# Patient Record
Sex: Male | Born: 1975 | Race: White | Hispanic: No | Marital: Married | Smoking: Never smoker
Health system: Southern US, Community
[De-identification: ages and names within clinical notes are randomized; demographics above are authoritative.]

## PROBLEM LIST (undated history)

## (undated) DIAGNOSIS — G4733 Obstructive sleep apnea (adult) (pediatric): Secondary | ICD-10-CM

## (undated) DIAGNOSIS — G4730 Sleep apnea, unspecified: Secondary | ICD-10-CM

## (undated) DIAGNOSIS — K649 Unspecified hemorrhoids: Secondary | ICD-10-CM

## (undated) DIAGNOSIS — D696 Thrombocytopenia, unspecified: Secondary | ICD-10-CM

## (undated) DIAGNOSIS — E039 Hypothyroidism, unspecified: Secondary | ICD-10-CM

## (undated) DIAGNOSIS — Z9989 Dependence on other enabling machines and devices: Secondary | ICD-10-CM

## (undated) HISTORY — DX: Unspecified hemorrhoids: K64.9

## (undated) HISTORY — PX: OTHER SURGICAL HISTORY: SHX169

## (undated) HISTORY — DX: Hypothyroidism, unspecified: E03.9

## (undated) HISTORY — DX: Thrombocytopenia, unspecified: D69.6

## (undated) HISTORY — DX: Dependence on other enabling machines and devices: Z99.89

## (undated) HISTORY — DX: Obstructive sleep apnea (adult) (pediatric): G47.33

## (undated) HISTORY — DX: Sleep apnea, unspecified: G47.30

---

## 2007-01-09 ENCOUNTER — Ambulatory Visit: Payer: Self-pay | Admitting: Family Medicine

## 2008-01-02 ENCOUNTER — Observation Stay: Payer: Self-pay | Admitting: Internal Medicine

## 2008-01-02 ENCOUNTER — Other Ambulatory Visit: Payer: Self-pay

## 2010-08-08 HISTORY — PX: OTHER SURGICAL HISTORY: SHX169

## 2010-08-30 ENCOUNTER — Ambulatory Visit: Payer: Self-pay | Admitting: Family Medicine

## 2013-09-13 ENCOUNTER — Ambulatory Visit: Payer: Self-pay | Admitting: Unknown Physician Specialty

## 2016-03-24 DIAGNOSIS — E038 Other specified hypothyroidism: Secondary | ICD-10-CM | POA: Insufficient documentation

## 2016-04-22 ENCOUNTER — Inpatient Hospital Stay: Payer: BC Managed Care – PPO | Attending: Internal Medicine | Admitting: Internal Medicine

## 2016-04-22 ENCOUNTER — Encounter (INDEPENDENT_AMBULATORY_CARE_PROVIDER_SITE_OTHER): Payer: Self-pay

## 2016-04-22 ENCOUNTER — Encounter: Payer: Self-pay | Admitting: Internal Medicine

## 2016-04-22 DIAGNOSIS — R161 Splenomegaly, not elsewhere classified: Secondary | ICD-10-CM | POA: Diagnosis not present

## 2016-04-22 DIAGNOSIS — Z9989 Dependence on other enabling machines and devices: Secondary | ICD-10-CM

## 2016-04-22 DIAGNOSIS — G4733 Obstructive sleep apnea (adult) (pediatric): Secondary | ICD-10-CM | POA: Diagnosis not present

## 2016-04-22 DIAGNOSIS — D696 Thrombocytopenia, unspecified: Secondary | ICD-10-CM | POA: Diagnosis not present

## 2016-04-22 DIAGNOSIS — E039 Hypothyroidism, unspecified: Secondary | ICD-10-CM | POA: Insufficient documentation

## 2016-04-22 DIAGNOSIS — Q7143 Longitudinal reduction defect of radius, bilateral: Secondary | ICD-10-CM | POA: Insufficient documentation

## 2016-04-22 DIAGNOSIS — Z79899 Other long term (current) drug therapy: Secondary | ICD-10-CM | POA: Diagnosis not present

## 2016-04-22 NOTE — Progress Notes (Signed)
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     HISTORY OF PRESENTING ILLNESS:  Kent Gordon 40 y.o.  male very pleasant, with a history of congenital bilateral absent radius and long-standing history of thrombocytopenia- has been referred to [place] for further evaluation for his mildly worsening thrombocytopenia.  Patient denies any easy bruising or bleeding. Denies any gum bleeding or nose bleeds. No blood in stools black red stools. His appetite in general is good. Denies any nausea vomiting.  Patient states that his platelets have been > 100 for many years- but more recently started trending down to 90s to 80s.    ROS: A complete 10 point review of system is done which is negative except mentioned above in history of present illness  MEDICAL HISTORY:  Past Medical History:  Diagnosis Date  . Hemorrhoids   . Hypothyroidism   . Obstructive sleep apnea on CPAP   . Sleep apnea   . Thrombocytopenia ([place])     SURGICAL HISTORY: Past Surgical History:  Procedure Laterality Date  . Radius surgeries x 6    . Right ACL surgery  2012    SOCIAL HISTORY: ocassional alochol; no smoking; assistant principal; [name], [employer] Social History   Social History  . Marital status: Married    Spouse name: N/A  . Number of children: N/A  . Years of education: N/A   Occupational History  . Not on file.   Social History Main Topics  . Smoking status: Never Smoker  . Smokeless tobacco: Never Used  . Alcohol use No  . Drug use: Unknown  . Sexual activity: Not on file   Other Topics Concern  . Not on file   Social History Narrative  . No narrative on file    FAMILY HISTORY: grandma/mat- low platelets.  History reviewed. No pertinent family history.  ALLERGIES:  has No Known  Allergies.  MEDICATIONS:  Current Outpatient Prescriptions  Medication Sig Dispense Refill  . levothyroxine (SYNTHROID, LEVOTHROID) 50 MCG tablet Take 1 tablet by mouth every morning.     No current facility-administered medications for this visit.       Marland Kitchen  PHYSICAL EXAMINATION: ECOG PERFORMANCE STATUS: 0 - Asymptomatic  Vitals:   04/22/16 1337  BP: 139/80  Pulse: (!) 110  Resp: 20  Temp: 98.1 F (36.7 C)   Filed Weights   04/22/16 1337  Weight: 210 lb (95.3 kg)    GENERAL: Well-nourished well-developed; Alert, no distress and comfortable.   Alone.  EYES: no pallor or icterus OROPHARYNX: no thrush or ulceration; good dentition  NECK: supple, no masses felt LYMPH:  no palpable lymphadenopathy in the cervical, axillary or inguinal regions LUNGS: clear to auscultation and  No wheeze or crackles HEART/CVS: regular rate & rhythm and no murmurs; No lower extremity edema ABDOMEN: abdomen soft, non-tender and normal bowel sounds Musculoskeletal:no cyanosis of digits and no clubbing; has bilateral upper extremity deformities PSYCH: alert & oriented x 3 with fluent speech NEURO: no focal motor/sensory deficits SKIN:  no rashes or significant lesions  LABORATORY DATA:  I have reviewed the data as listed No results found for: WBC, HGB, HCT, MCV, PLT No results for input(s): NA, K, CL, CO2, GLUCOSE, BUN, CREATININE, CALCIUM, GFRNONAA, GFRAA, PROT, ALBUMIN, AST, ALT, ALKPHOS, BILITOT, BILIDIR, IBILI in the last 8760 hours.  RADIOGRAPHIC STUDIES: I have personally reviewed the radiological images as listed and agreed with the findings in the report. No results found.  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 However more recently platelets have been running low 80s to 90s the etiology of the trend in platelets is unclear.  # Check CBC CMP; B12  flow cytometry hepatitis panel also check CT  of the abdomen and pelvis to rule out splenomegaly/liver disease. Discussed with the patient that if his platelets are significantly lower from his baseline that I would recommend a bone marrow biopsy for further evaluation.   # The above plan of care was discussed with the patient in detail. Patient will otherwise follow-up with me in approximately 3 weeks or so to review the results.  Thank you Dr/Linthavong for allowing me to participate in the care of your pleasant patient. Please do not hesitate to contact me with questions or concerns in the interim.  # 45 minutes face-to-face with the patient discussing the above plan of care; more than 50% of time spent on prognosis/ natural history; counseling and coordination.  All questions were answered. The patient knows to call the clinic with any problems, questions or concerns.     Cammie Sickle, MD 04/24/2016 11:01 AM

## 2016-04-22 NOTE — Progress Notes (Signed)
Patient here for hematology consult with Dr. Donneta RombergBrahmanday. He reports Chronic h/o thromobocytopenia since childhood.   Recently dx with hypothyroidism. Fatigue-(grade 1) has improved since starting synthroid 50 mcg.

## 2016-04-24 NOTE — Assessment & Plan Note (Signed)
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 However more recently platelets have been running low 80s to 90s the etiology of the trend in platelets is unclear.  # Check CBC CMP; B12  flow cytometry hepatitis panel also check CT of the abdomen and pelvis to rule out splenomegaly/liver disease. Discussed with the patient that if his platelets are significantly lower from his baseline that I would recommend a bone marrow biopsy for further evaluation.   # The above plan of care was discussed with the patient in detail. Patient will otherwise follow-up with me in approximately 3 weeks or so to review the results.  Thank you Dr/Linthavong for allowing me to participate in the care of your pleasant patient. Please do not hesitate to contact me with questions or concerns in the interim.  # 45 minutes face-to-face with the patient discussing the above plan of care; more than 50% of time spent on prognosis/ natural history; counseling and coordination.

## 2016-04-25 ENCOUNTER — Encounter (INDEPENDENT_AMBULATORY_CARE_PROVIDER_SITE_OTHER): Payer: Self-pay

## 2016-04-25 ENCOUNTER — Inpatient Hospital Stay: Payer: BC Managed Care – PPO

## 2016-04-25 DIAGNOSIS — D696 Thrombocytopenia, unspecified: Secondary | ICD-10-CM

## 2016-04-25 LAB — CBC WITH DIFFERENTIAL/PLATELET
BASOS ABS: 0 10*3/uL (ref 0–0.1)
BASOS PCT: 0 %
EOS ABS: 0.3 10*3/uL (ref 0–0.7)
Eosinophils Relative: 2 %
HEMATOCRIT: 42 % (ref 40.0–52.0)
HEMOGLOBIN: 14.8 g/dL (ref 13.0–18.0)
Lymphocytes Relative: 20 %
Lymphs Abs: 2.8 10*3/uL (ref 1.0–3.6)
MCH: 30 pg (ref 26.0–34.0)
MCHC: 35.2 g/dL (ref 32.0–36.0)
MCV: 85.2 fL (ref 80.0–100.0)
MONOS PCT: 4 %
Monocytes Absolute: 0.5 10*3/uL (ref 0.2–1.0)
NEUTROS ABS: 10.2 10*3/uL — AB (ref 1.4–6.5)
NEUTROS PCT: 74 %
Platelets: 107 10*3/uL — ABNORMAL LOW (ref 150–440)
RBC: 4.93 MIL/uL (ref 4.40–5.90)
RDW: 15.6 % — ABNORMAL HIGH (ref 11.5–14.5)
WBC: 13.8 10*3/uL — AB (ref 3.8–10.6)

## 2016-04-25 LAB — COMPREHENSIVE METABOLIC PANEL
ALBUMIN: 4.6 g/dL (ref 3.5–5.0)
ALK PHOS: 51 U/L (ref 38–126)
ALT: 19 U/L (ref 17–63)
ANION GAP: 8 (ref 5–15)
AST: 17 U/L (ref 15–41)
BILIRUBIN TOTAL: 1.6 mg/dL — AB (ref 0.3–1.2)
BUN: 16 mg/dL (ref 6–20)
CALCIUM: 8.8 mg/dL — AB (ref 8.9–10.3)
CO2: 23 mmol/L (ref 22–32)
Chloride: 105 mmol/L (ref 101–111)
Creatinine, Ser: 0.93 mg/dL (ref 0.61–1.24)
Glucose, Bld: 109 mg/dL — ABNORMAL HIGH (ref 65–99)
POTASSIUM: 3.7 mmol/L (ref 3.5–5.1)
Sodium: 136 mmol/L (ref 135–145)
TOTAL PROTEIN: 7.2 g/dL (ref 6.5–8.1)

## 2016-04-25 LAB — LACTATE DEHYDROGENASE: LDH: 104 U/L (ref 98–192)

## 2016-04-25 LAB — VITAMIN B12: VITAMIN B 12: 433 pg/mL (ref 180–914)

## 2016-04-26 LAB — HEPATITIS B SURFACE ANTIGEN: Hepatitis B Surface Ag: NEGATIVE

## 2016-04-26 LAB — HEPATITIS B CORE ANTIBODY, IGM: Hep B C IgM: NEGATIVE

## 2016-04-26 LAB — HEPATITIS C ANTIBODY: HCV Ab: <0.1 s/co ratio (ref 0.0–0.9)

## 2016-04-28 ENCOUNTER — Ambulatory Visit: Payer: BC Managed Care – PPO

## 2016-04-28 LAB — COMP PANEL: LEUKEMIA/LYMPHOMA

## 2016-04-29 ENCOUNTER — Ambulatory Visit: Admission: RE | Admit: 2016-04-29 | Payer: BC Managed Care – PPO | Source: Ambulatory Visit

## 2016-05-02 ENCOUNTER — Other Ambulatory Visit: Payer: Self-pay | Admitting: Internal Medicine

## 2016-05-02 DIAGNOSIS — R161 Splenomegaly, not elsewhere classified: Secondary | ICD-10-CM

## 2016-05-02 NOTE — Progress Notes (Signed)
Cancel the CT A/P- I ordered CT Abdomen- with constrast.

## 2016-05-04 ENCOUNTER — Ambulatory Visit: Payer: BC Managed Care – PPO

## 2016-05-04 ENCOUNTER — Ambulatory Visit
Admission: RE | Admit: 2016-05-04 | Discharge: 2016-05-04 | Disposition: A | Discharge status: Home / Self Care | Payer: BC Managed Care – PPO | Source: Ambulatory Visit | Attending: Internal Medicine | Admitting: Internal Medicine

## 2016-05-04 DIAGNOSIS — R161 Splenomegaly, not elsewhere classified: Secondary | ICD-10-CM | POA: Insufficient documentation

## 2016-05-04 MED ORDER — IOPAMIDOL (ISOVUE-300) INJECTION 61%
100.0000 mL | Freq: Once | INTRAVENOUS | Status: AC | PRN
  Administered 2016-05-04: 100 mL via INTRAVENOUS

## 2016-05-09 ENCOUNTER — Telehealth: Payer: Self-pay | Admitting: *Deleted

## 2016-05-09 NOTE — Telephone Encounter (Signed)
-----  Message from Cammie Sickle, MD sent at 05/09/2016  1:31 PM EDT ----- Please inform patient that scan showed no splenomegaly;  we will plan to hold off bone marrow biopsy at this time; plan follow-up as scheduled.

## 2016-05-09 NOTE — Telephone Encounter (Signed)
Per patient's request -   Contacted patient via work number. Left pt a voice mail on work phone to contact cancer ctr.

## 2016-05-09 NOTE — Telephone Encounter (Signed)
Left vm asking patient to return my phone call to discuss test results.

## 2016-05-09 NOTE — Telephone Encounter (Signed)
Returning [place] call asked to call back on cell or at work [phone] and tell them it is MD office calling.

## 2016-05-13 ENCOUNTER — Ambulatory Visit: Payer: Self-pay | Admitting: Internal Medicine

## 2016-05-18 ENCOUNTER — Encounter: Payer: Self-pay | Admitting: Internal Medicine

## 2016-05-18 ENCOUNTER — Inpatient Hospital Stay: Payer: BC Managed Care – PPO | Attending: Internal Medicine | Admitting: Internal Medicine

## 2016-05-18 VITALS — BP 121/81 | HR 94 | Temp 96.7°F | Resp 17 | Ht 68.5 in | Wt 212.8 lb

## 2016-05-18 DIAGNOSIS — G4733 Obstructive sleep apnea (adult) (pediatric): Secondary | ICD-10-CM | POA: Diagnosis not present

## 2016-05-18 DIAGNOSIS — E039 Hypothyroidism, unspecified: Secondary | ICD-10-CM | POA: Insufficient documentation

## 2016-05-18 DIAGNOSIS — Z9989 Dependence on other enabling machines and devices: Secondary | ICD-10-CM | POA: Insufficient documentation

## 2016-05-18 DIAGNOSIS — D696 Thrombocytopenia, unspecified: Secondary | ICD-10-CM | POA: Insufficient documentation

## 2016-05-18 DIAGNOSIS — Z79899 Other long term (current) drug therapy: Secondary | ICD-10-CM

## 2016-05-18 NOTE — Progress Notes (Signed)
CT was 9/26

## 2016-05-18 NOTE — Assessment & Plan Note (Addendum)
#  Thrombocytopenia- unclear etiology; possible ITP. Most recent platelets 107/significant decline. Chronic. CT scan negative for cirrhosis or splenomegaly.  # Discussed regarding bone marrow biopsy; however warranted as patient is currently asymptomatic/and platelets are stable.  # Follow-up CBC in 3 months follow-up with me in 6 months with labs. He will call us sooner if the platelets drop; already gets symptomatic.

## 2016-05-18 NOTE — Progress Notes (Signed)
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   # Congenital Bilateral Absent Radii   No history exists.     HISTORY OF PRESENTING ILLNESS:  Kent Gordon 40 y.o.  male very pleasant, with a history of congenital bilateral absent radius and long-standing history of thrombocytopenia- Is here to review the results of his CAT scan.  Patient continues to be asymptomatic. Denies any bleeding.  Patient states that his platelets have been > 100 for many years- but more recently started trending down to 90s to 80s; however repeat blood count with [place] 3 weeks ago was 107  ROS: A complete 10 point review of system is done which is negative except mentioned above in history of present illness  MEDICAL HISTORY:  Past Medical History:  Diagnosis Date  . Hemorrhoids   . Hypothyroidism   . Obstructive sleep apnea on CPAP   . Sleep apnea   . Thrombocytopenia ([place])     SURGICAL HISTORY: Past Surgical History:  Procedure Laterality Date  . Radius surgeries x 6    . Right ACL surgery  2012    SOCIAL HISTORY: ocassional alochol; no smoking; assistant principal; [name], [employer] Social History   Social History  . Marital status: Married    Spouse name: N/A  . Number of children: N/A  . Years of education: N/A   Occupational History  . Not on file.   Social History Main Topics  . Smoking status: Never Smoker  . Smokeless tobacco: Never Used  . Alcohol use No  . Drug use: No  . Sexual activity: Not on file   Other Topics Concern  . Not on file   Social History Narrative  . No narrative on file    FAMILY HISTORY: grandma/mat- low platelets.  History reviewed. No pertinent family history.  ALLERGIES:  has No Known  Allergies.  MEDICATIONS:  Current Outpatient Prescriptions  Medication Sig Dispense Refill  . levothyroxine (SYNTHROID, LEVOTHROID) 50 MCG tablet Take 1 tablet by mouth every morning.     No current facility-administered medications for this visit.       Marland Kitchen  PHYSICAL EXAMINATION: ECOG PERFORMANCE STATUS: 0 - Asymptomatic  Vitals:   05/18/16 1549  BP: 121/81  Pulse: 94  Resp: 17  Temp: (!) 96.7 F (35.9 C)   Filed Weights   05/18/16 1549  Weight: 212 lb 12.8 oz (96.5 kg)    GENERAL: Well-nourished well-developed; Alert, no distress and comfortable.   Alone.  EYES: no pallor or icterus OROPHARYNX: no thrush or ulceration; good dentition  NECK: supple, no masses felt LYMPH:  no palpable lymphadenopathy in the cervical, axillary or inguinal regions LUNGS: clear to auscultation and  No wheeze or crackles HEART/CVS: regular rate & rhythm and no murmurs; No lower extremity edema ABDOMEN: abdomen soft, non-tender and normal bowel sounds Musculoskeletal:no cyanosis of digits and no clubbing; has bilateral upper extremity deformities PSYCH: alert & oriented x 3 with fluent speech NEURO: no focal motor/sensory deficits SKIN:  no rashes or significant lesions  LABORATORY DATA:  I have reviewed the data as listed Lab Results  Component Value Date   WBC 13.8 (H) 04/25/2016   HGB 14.8 04/25/2016   HCT 42.0 04/25/2016   MCV 85.2 04/25/2016   PLT 107 (L) 04/25/2016  Recent Labs  04/25/16 1431  NA 136  K 3.7  CL 105  CO2 23  GLUCOSE 109*  BUN 16  CREATININE 0.93  CALCIUM 8.8*  GFRNONAA >60  GFRAA >60  PROT 7.2  ALBUMIN 4.6  AST 17  ALT 19  ALKPHOS 51  BILITOT 1.6*    RADIOGRAPHIC STUDIES: I have personally reviewed the radiological images as listed and agreed with the findings in the report. Ct Abdomen W Contrast  Result Date: 05/04/2016 CLINICAL DATA:  Splenomegaly EXAM: CT ABDOMEN WITH CONTRAST TECHNIQUE: Multidetector CT imaging of the abdomen was  performed using the standard protocol following bolus administration of intravenous contrast. CONTRAST:  150m ISOVUE-300 IOPAMIDOL (ISOVUE-300) INJECTION 61% COMPARISON:  None. FINDINGS: Lower chest: Lung bases are essentially clear. Hepatobiliary: Liver is within normal limits. Gallbladder is unremarkable. No intrahepatic or extrahepatic ductal dilatation. Pancreas: Within normal limits. Spleen: Within normal limits for size. Maximal dimension is 12.6 cm in the oblique sagittal plane (series 6/ image 94). No discrete splenic mass is seen. Adrenals/Urinary Tract: Adrenal glands are within normal limits. Kidneys are within normal limits.  No hydronephrosis. Stomach/Bowel: Stomach is within normal limits. Visualized bowel is grossly unremarkable. Vascular/Lymphatic: No evidence of abdominal aortic aneurysm. No suspicious abdominal lymphadenopathy. Other: No abdominal ascites. Musculoskeletal: Mild degenerative changes of the lower lumbar spine. IMPRESSION: Spleen is within normal limits for size, measuring 12.6 cm in maximal dimension. No discrete splenic mass is seen. Otherwise unremarkable CT abdomen. Please note that the pelvis was not imaged. Electronically Signed   By: SJulian HyM.D.   On: 05/04/2016 17:30    ASSESSMENT & PLAN:   [place] ([place] # Thrombocytopenia- unclear etiology; possible ITP. Most recent platelets 107/significant decline. Chronic. CT scan negative for cirrhosis or splenomegaly.  # Discussed regarding bone marrow biopsy; however warranted as patient is currently asymptomatic/and platelets are stable.  # Follow-up CBC in 3 months follow-up with me in 6 months with labs. He will [place] if the platelets drop; already gets symptomatic.      GCammie Sickle MD 05/18/2016 4:19 PM

## 2016-08-18 ENCOUNTER — Inpatient Hospital Stay: Payer: BC Managed Care – PPO

## 2016-11-16 ENCOUNTER — Inpatient Hospital Stay: Payer: BC Managed Care – PPO

## 2016-11-16 ENCOUNTER — Inpatient Hospital Stay: Payer: BC Managed Care – PPO | Admitting: Internal Medicine

## 2016-12-05 ENCOUNTER — Encounter: Payer: Self-pay | Admitting: Internal Medicine

## 2016-12-07 ENCOUNTER — Inpatient Hospital Stay: Payer: BC Managed Care – PPO | Attending: Internal Medicine

## 2016-12-07 ENCOUNTER — Inpatient Hospital Stay (HOSPITAL_BASED_OUTPATIENT_CLINIC_OR_DEPARTMENT_OTHER): Payer: BC Managed Care – PPO | Admitting: Internal Medicine

## 2016-12-07 VITALS — BP 126/85 | HR 98 | Temp 98.8°F | Wt 209.1 lb

## 2016-12-07 DIAGNOSIS — D696 Thrombocytopenia, unspecified: Secondary | ICD-10-CM | POA: Insufficient documentation

## 2016-12-07 DIAGNOSIS — Z9989 Dependence on other enabling machines and devices: Secondary | ICD-10-CM | POA: Insufficient documentation

## 2016-12-07 DIAGNOSIS — G4733 Obstructive sleep apnea (adult) (pediatric): Secondary | ICD-10-CM | POA: Diagnosis not present

## 2016-12-07 DIAGNOSIS — E039 Hypothyroidism, unspecified: Secondary | ICD-10-CM

## 2016-12-07 DIAGNOSIS — Z79899 Other long term (current) drug therapy: Secondary | ICD-10-CM | POA: Diagnosis not present

## 2016-12-07 DIAGNOSIS — Q7143 Longitudinal reduction defect of radius, bilateral: Secondary | ICD-10-CM | POA: Diagnosis not present

## 2016-12-07 LAB — CBC WITH DIFFERENTIAL/PLATELET
BASOS ABS: 0.2 10*3/uL — AB (ref 0–0.1)
BASOS PCT: 1 %
EOS ABS: 0.2 10*3/uL (ref 0–0.7)
EOS PCT: 2 %
HCT: 43.3 % (ref 40.0–52.0)
Hemoglobin: 15.2 g/dL (ref 13.0–18.0)
Lymphocytes Relative: 20 %
Lymphs Abs: 2.7 10*3/uL (ref 1.0–3.6)
MCH: 29.7 pg (ref 26.0–34.0)
MCHC: 35 g/dL (ref 32.0–36.0)
MCV: 85 fL (ref 80.0–100.0)
MONO ABS: 0.5 10*3/uL (ref 0.2–1.0)
Monocytes Relative: 4 %
Neutro Abs: 9.9 10*3/uL — ABNORMAL HIGH (ref 1.4–6.5)
Neutrophils Relative %: 73 %
PLATELETS: 98 10*3/uL — AB (ref 150–440)
RBC: 5.1 MIL/uL (ref 4.40–5.90)
RDW: 15.4 % — AB (ref 11.5–14.5)
WBC: 13.5 10*3/uL — AB (ref 3.8–10.6)

## 2016-12-07 NOTE — Progress Notes (Signed)
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   # Congenital Bilateral Absent Radii   No history exists.     HISTORY OF PRESENTING ILLNESS:  Kent Gordon 41 y.o.  male very pleasant, with a history of congenital bilateral absent radius and long-standing history of thrombocytopenia- Is here for follow-up.  Patient continues to be asymptomatic. Denies any bleeding. His fairly active. No petechial rash. No weight loss or loss of appetite or night sweats.  ROS: A complete 10 point review of system is done which is negative except mentioned above in history of present illness  MEDICAL HISTORY:  Past Medical History:  Diagnosis Date  . Hemorrhoids   . Hypothyroidism   . Obstructive sleep apnea on CPAP   . Sleep apnea   . Thrombocytopenia ([place])     SURGICAL HISTORY: Past Surgical History:  Procedure Laterality Date  . Radius surgeries x 6    . Right ACL surgery  2012    SOCIAL HISTORY: ocassional alochol; no smoking; assistant principal; [name], [employer] Social History   Social History  . Marital status: Married    Spouse name: N/A  . Number of children: N/A  . Years of education: N/A   Occupational History  . Not on file.   Social History Main Topics  . Smoking status: Never Smoker  . Smokeless tobacco: Never Used  . Alcohol use No  . Drug use: No  . Sexual activity: Not on file   Other Topics Concern  . Not on file   Social History Narrative  . No narrative on file    FAMILY HISTORY: grandma/mat- low platelets.  No family history on file.  ALLERGIES:  has No Known Allergies.  MEDICATIONS:  Current Outpatient Prescriptions  Medication Sig Dispense Refill  . levothyroxine (SYNTHROID, LEVOTHROID) 50  MCG tablet Take 1 tablet by mouth every morning.     No current facility-administered medications for this visit.       Marland Kitchen  PHYSICAL EXAMINATION: ECOG PERFORMANCE STATUS: 0 - Asymptomatic  Vitals:   12/07/16 1520  BP: 126/85  Pulse: 98  Temp: 98.8 F (37.1 C)   Filed Weights   12/07/16 1520  Weight: 209 lb 2 oz (94.9 kg)    GENERAL: Well-nourished well-developed; Alert, no distress and comfortable.   Alone.  EYES: no pallor or icterus OROPHARYNX: no thrush or ulceration; good dentition  NECK: supple, no masses felt LYMPH:  no palpable lymphadenopathy in the cervical, axillary or inguinal regions LUNGS: clear to auscultation and  No wheeze or crackles HEART/CVS: regular rate & rhythm and no murmurs; No lower extremity edema ABDOMEN: abdomen soft, non-tender and normal bowel sounds Musculoskeletal:no cyanosis of digits and no clubbing; has bilateral upper extremity deformities PSYCH: alert & oriented x 3 with fluent speech NEURO: no focal motor/sensory deficits SKIN:  no rashes or significant lesions  LABORATORY DATA:  I have reviewed the data as listed Lab Results  Component Value Date   WBC 13.5 (H) 12/07/2016   HGB 15.2 12/07/2016   HCT 43.3 12/07/2016   MCV 85.0 12/07/2016   PLT 98 (L) 12/07/2016    Recent Labs  04/25/16 1431  NA 136  K 3.7  CL 105  CO2 23  GLUCOSE 109*  BUN 16  CREATININE 0.93  CALCIUM  8.8*  GFRNONAA >60  GFRAA >60  PROT 7.2  ALBUMIN 4.6  AST 17  ALT 19  ALKPHOS 51  BILITOT 1.6*    RADIOGRAPHIC STUDIES: I have personally reviewed the radiological images as listed and agreed with the findings in the report. No results found.  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 Platelets today- 98/ no significant decline. . CT scan negative for cirrhosis or splenomegaly.  # Discussed regarding bone marrow biopsy; however would not recommend as patient is currently  asymptomatic/and platelets are stable. A trial of steroids might also be reasonable prior to bone marrow biopsy.  # Follow-up CBC/MD in 6 m;  He will call us sooner if the platelets drop; already gets symptomatic.      Cammie Sickle, MD 12/07/2016 3:49 PM

## 2016-12-07 NOTE — Progress Notes (Signed)
Patient here today for follow up.  Patient states no new concerns today  

## 2016-12-07 NOTE — Assessment & Plan Note (Addendum)
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 Platelets today- 98/ no significant decline. . CT scan negative for cirrhosis or splenomegaly.  # Discussed regarding bone marrow biopsy; however would not recommend as patient is currently asymptomatic/and platelets are stable. A trial of steroids might also be reasonable prior to bone marrow biopsy.  # Follow-up CBC/MD in 6 m;  He will call us sooner if the platelets drop; already gets symptomatic.

## 2017-03-21 DIAGNOSIS — E669 Obesity, unspecified: Secondary | ICD-10-CM | POA: Insufficient documentation

## 2017-06-09 ENCOUNTER — Inpatient Hospital Stay: Payer: BC Managed Care – PPO

## 2017-06-09 ENCOUNTER — Inpatient Hospital Stay: Payer: BC Managed Care – PPO | Attending: Internal Medicine | Admitting: Internal Medicine

## 2017-06-09 VITALS — BP 115/86 | HR 88 | Temp 97.4°F | Resp 16 | Wt 211.8 lb

## 2017-06-09 DIAGNOSIS — Z9989 Dependence on other enabling machines and devices: Secondary | ICD-10-CM | POA: Diagnosis not present

## 2017-06-09 DIAGNOSIS — Q7143 Longitudinal reduction defect of radius, bilateral: Secondary | ICD-10-CM | POA: Insufficient documentation

## 2017-06-09 DIAGNOSIS — D696 Thrombocytopenia, unspecified: Secondary | ICD-10-CM | POA: Insufficient documentation

## 2017-06-09 DIAGNOSIS — E039 Hypothyroidism, unspecified: Secondary | ICD-10-CM | POA: Diagnosis not present

## 2017-06-09 DIAGNOSIS — G4733 Obstructive sleep apnea (adult) (pediatric): Secondary | ICD-10-CM | POA: Diagnosis not present

## 2017-06-09 DIAGNOSIS — Z79899 Other long term (current) drug therapy: Secondary | ICD-10-CM | POA: Insufficient documentation

## 2017-06-09 LAB — CBC WITH DIFFERENTIAL/PLATELET
Basophils Absolute: 0.1 10*3/uL (ref 0–0.1)
Basophils Relative: 1 %
EOS ABS: 0.3 10*3/uL (ref 0–0.7)
Eosinophils Relative: 2 %
HCT: 42.6 % (ref 40.0–52.0)
Hemoglobin: 14.7 g/dL (ref 13.0–18.0)
Lymphocytes Relative: 20 %
Lymphs Abs: 2.6 10*3/uL (ref 1.0–3.6)
MCH: 29.7 pg (ref 26.0–34.0)
MCHC: 34.6 g/dL (ref 32.0–36.0)
MCV: 85.8 fL (ref 80.0–100.0)
Monocytes Absolute: 0.6 10*3/uL (ref 0.2–1.0)
Monocytes Relative: 4 %
Neutro Abs: 9.5 10*3/uL — ABNORMAL HIGH (ref 1.4–6.5)
Neutrophils Relative %: 73 %
Platelets: 110 10*3/uL — ABNORMAL LOW (ref 150–440)
RBC: 4.97 MIL/uL (ref 4.40–5.90)
RDW: 15.2 % — AB (ref 11.5–14.5)
WBC: 13 10*3/uL — AB (ref 3.8–10.6)

## 2017-06-09 NOTE — Assessment & Plan Note (Addendum)
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 Platelets today- 98/ no significant decline.  CT scan negative for cirrhosis or splenomegaly.  # CHRONIC Mild Leucocytosis/neutrophilia- WBC 13- not increasing; monitor for now.   # Discussed regarding bone marrow biopsy; however would not recommend as patient is currently asymptomatic/and platelets are stable. A trial of steroids might also be reasonable prior to bone marrow biopsy.  # Follow-up CBC/MD in 12 m;  He will call us sooner if the platelets drop.  

## 2017-06-09 NOTE — Progress Notes (Signed)
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   # MILD CHRONIC NEUTROPHILIA- [?7078]  # Congenital Bilateral Absent Radii   No history exists.     HISTORY OF PRESENTING ILLNESS:  Kent Gordon 41 y.o.  male very pleasant, with a history of congenital bilateral absent radius and long-standing history of thrombocytopenia/mild neutrophilia- Is here for follow-up.  Patient denies any bleeding. Denies any petechial rash. No weight loss or loss of appetite or night sweats.  ROS: A complete 10 point review of system is done which is negative except mentioned above in history of present illness  MEDICAL HISTORY:  Past Medical History:  Diagnosis Date  . Hemorrhoids   . Hypothyroidism   . Obstructive sleep apnea on CPAP   . Sleep apnea   . Thrombocytopenia ([place])     SURGICAL HISTORY: Past Surgical History:  Procedure Laterality Date  . Radius surgeries x 6    . Right ACL surgery  2012    SOCIAL HISTORY: ocassional alochol; no smoking; assistant principal; Kent Gordon, [employer] Social History   Social History  . Marital status: Married    Spouse name: N/A  . Number of children: N/A  . Years of education: N/A   Occupational History  . Not on file.   Social History Main Topics  . Smoking status: Never Smoker  . Smokeless tobacco: Never Used  . Alcohol use No  . Drug use: No  . Sexual activity: Not on file   Other Topics Concern  . Not on file   Social History Narrative  . No narrative on file    FAMILY HISTORY: grandma/mat- low platelets.  No family history on file.  ALLERGIES:  has No Known Allergies.  MEDICATIONS:  Current Outpatient Prescriptions  Medication Sig Dispense Refill  . Levothyroxine Sodium 75  MCG CAPS Take 1 tablet by mouth every morning.     No current facility-administered medications for this visit.       Marland Kitchen  PHYSICAL EXAMINATION: ECOG PERFORMANCE STATUS: 0 - Asymptomatic  Vitals:   06/09/17 1122  BP: 115/86  Pulse: 88  Resp: 16  Temp: (!) 97.4 F (36.3 C)   Filed Weights   06/09/17 1122  Weight: 211 lb 12.8 oz (96.1 kg)    GENERAL: Well-nourished well-developed; Alert, no distress and comfortable.   Alone.  EYES: no pallor or icterus OROPHARYNX: no thrush or ulceration; good dentition  NECK: supple, no masses felt LYMPH:  no palpable lymphadenopathy in the cervical, axillary or inguinal regions LUNGS: clear to auscultation and  No wheeze or crackles HEART/CVS: regular rate & rhythm and no murmurs; No lower extremity edema ABDOMEN: abdomen soft, non-tender and normal bowel sounds Musculoskeletal:no cyanosis of digits and no clubbing; has bilateral upper extremity deformities PSYCH: alert & oriented x 3 with fluent speech NEURO: no focal motor/sensory deficits SKIN:  no rashes or significant lesions  LABORATORY DATA:  I have reviewed the data as listed Lab Results  Component Value Date   WBC 13.0 (H) 06/09/2017   HGB 14.7 06/09/2017   HCT 42.6 06/09/2017   MCV 85.8 06/09/2017   PLT 110 (L) 06/09/2017   No results for input(s): NA, K, CL, CO2, GLUCOSE, BUN, CREATININE, CALCIUM, GFRNONAA, GFRAA, PROT, ALBUMIN, AST, ALT, ALKPHOS, BILITOT, BILIDIR, IBILI in the last  8760 hours.  RADIOGRAPHIC STUDIES: I have personally reviewed the radiological images as listed and agreed with the findings in the report. No results found.  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 Platelets today- 98/ no significant decline.  CT scan negative for cirrhosis or splenomegaly.  # CHRONIC Mild Leucocytosis/neutrophilia- WBC 13- not increasing; monitor for now.   # Discussed regarding bone marrow biopsy;  however would not recommend as patient is currently asymptomatic/and platelets are stable. A trial of steroids might also be reasonable prior to bone marrow biopsy.  # Follow-up CBC/MD in 12 m;  He will call us sooner if the platelets drop.      Kent Sickle, MD 06/09/2017 1:17 PM

## 2017-08-07 IMAGING — CT CT ABDOMEN W/ CM
1 of 3 series · 13 of 32 positions shown, 19 images · IV contrast (APPLIED)
Comparison: None.

CLINICAL DATA: Splenomegaly

EXAM:
CT ABDOMEN WITH CONTRAST
TECHNIQUE: Multidetector CT imaging of the abdomen was performed using the
standard protocol following bolus administration of intravenous
contrast.
CONTRAST:  100mL [id number]-[id number] IOPAMIDOL (FH5B63-YQQ) INJECTION 61%

[Series 2: axial st · axial · 0.81mm/px · z∈[-916,-662]mm · 13 of 59 slices shown, 19 images]
[im 4/59  soft-tissue]
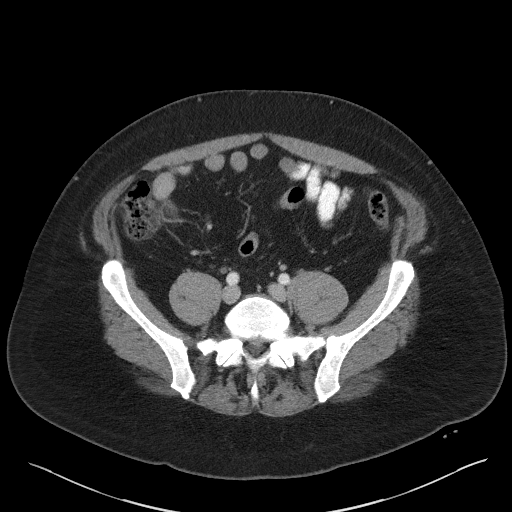
[im 4/59  bone]
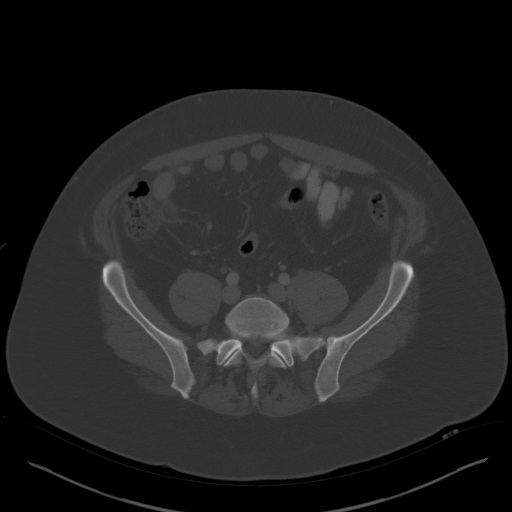
[im 8/59  soft-tissue]
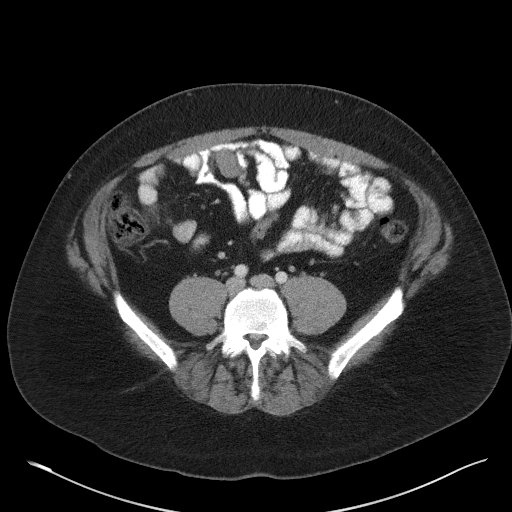
[im 12/59  soft-tissue]
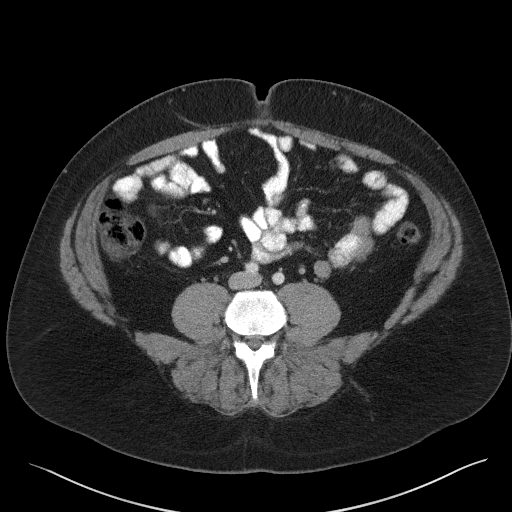
[im 16/59  soft-tissue]
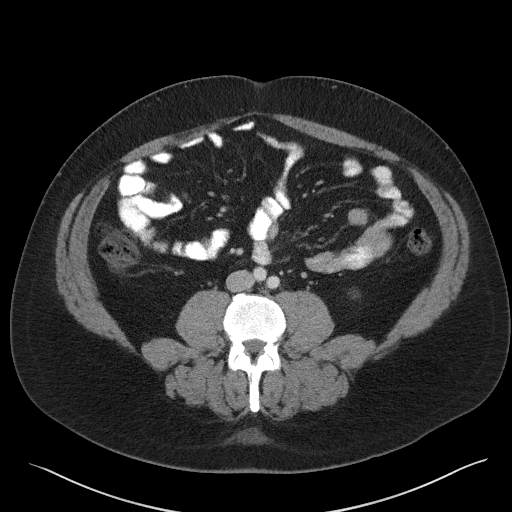
[im 20/59  soft-tissue]
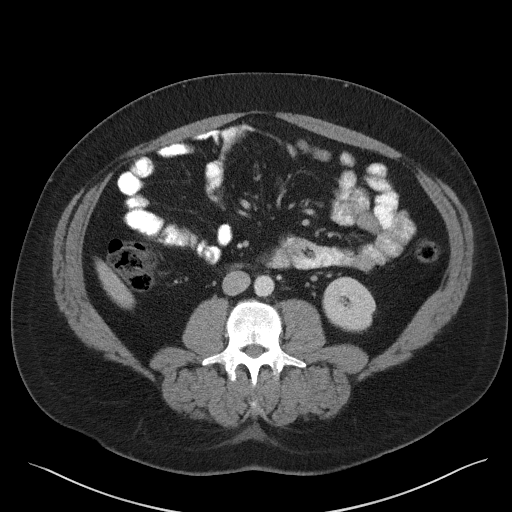
[im 24/59  soft-tissue]
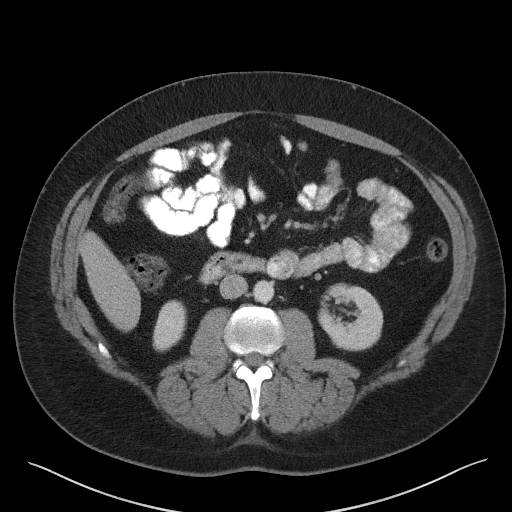
[im 31/59  soft-tissue]
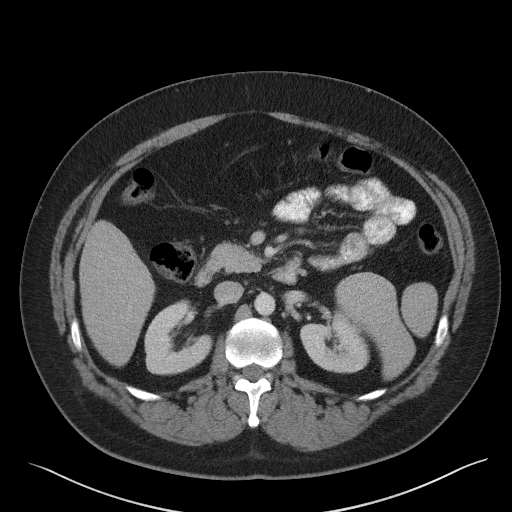
[im 35/59  soft-tissue]
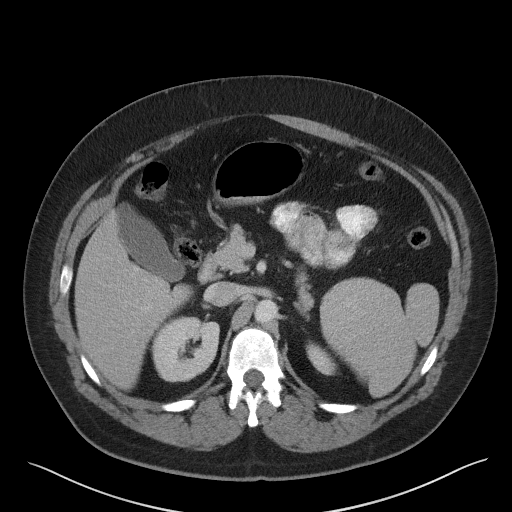
[im 39/59  soft-tissue]
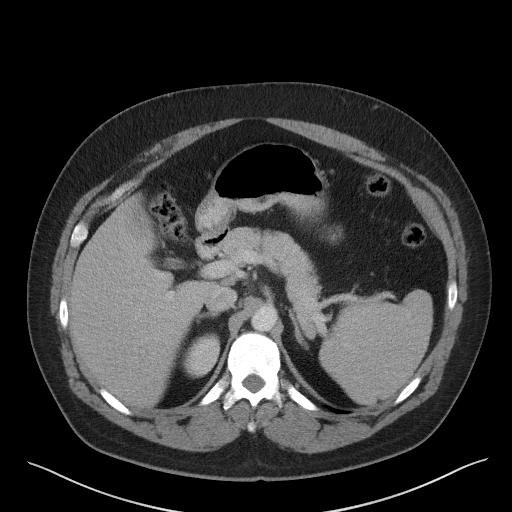
[im 39/59  bone]
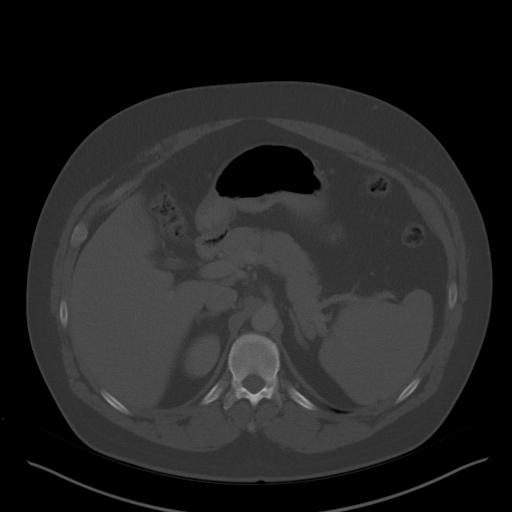
[im 43/59  soft-tissue]
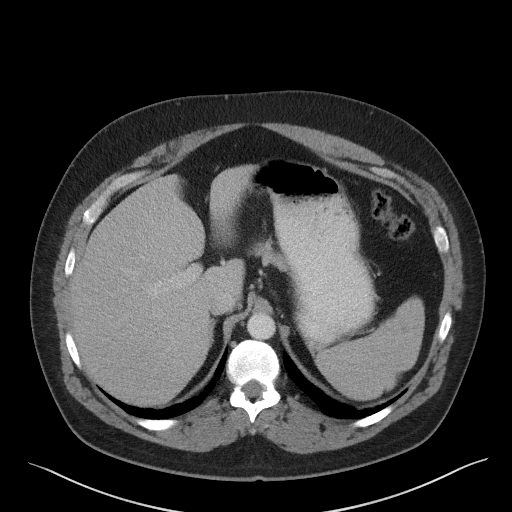
[im 43/59  lung]
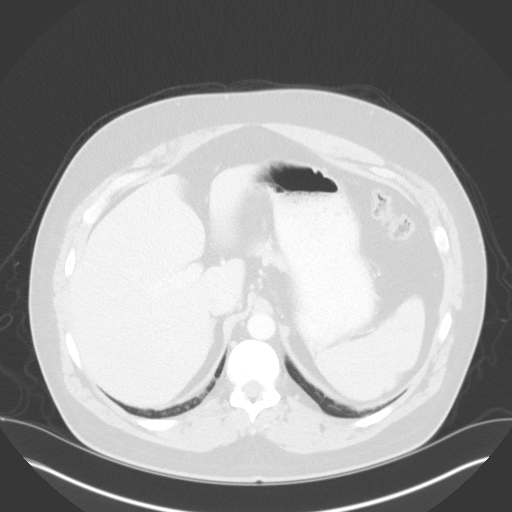
[im 47/59  soft-tissue]
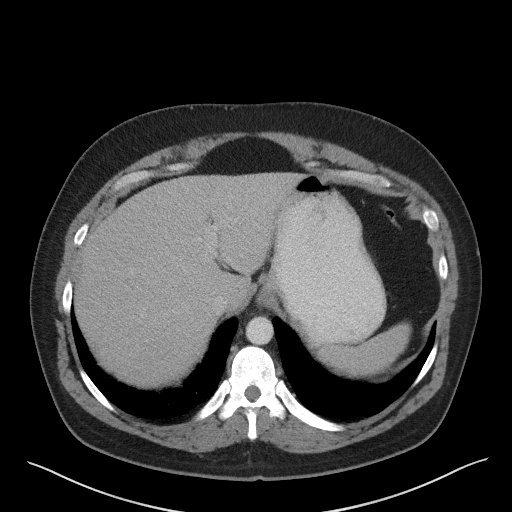
[im 47/59  lung]
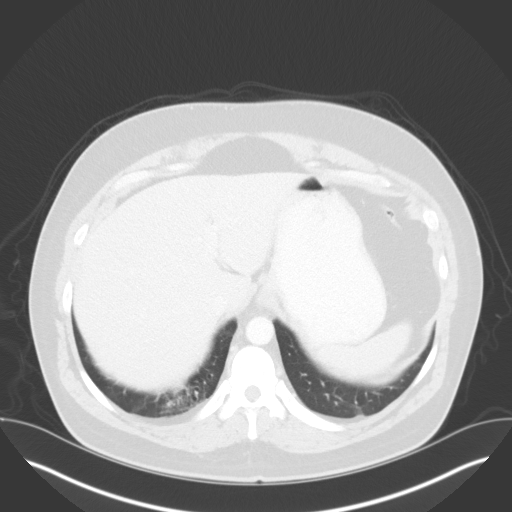
[im 51/59  soft-tissue]
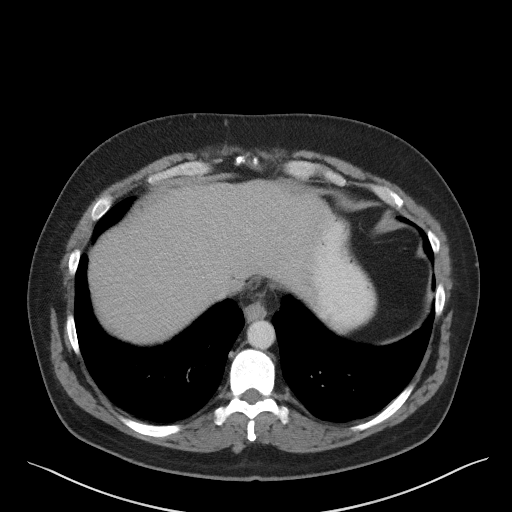
[im 51/59  lung]
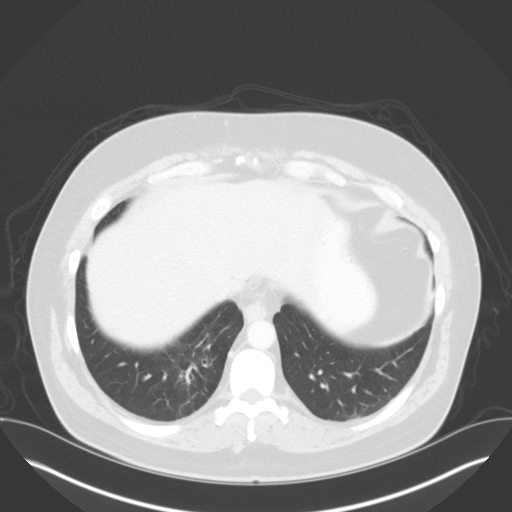
[im 55/59  soft-tissue]
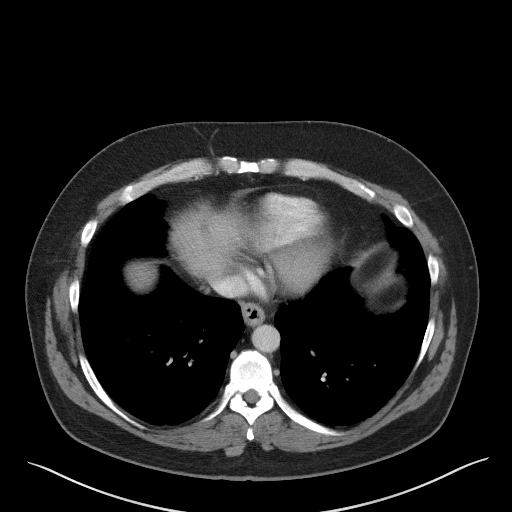
[im 55/59  lung]
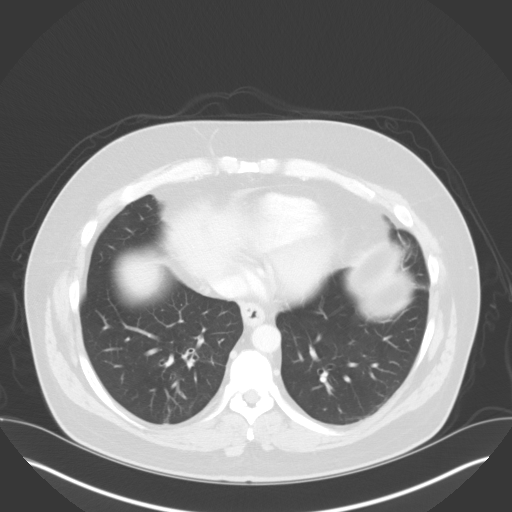

[13 of 32 positions shown; findings below may reference images not displayed]

FINDINGS: Lower chest: Lung bases are essentially clear.

Hepatobiliary: Liver is within normal limits.

Gallbladder is unremarkable. No intrahepatic or extrahepatic ductal
dilatation.

Pancreas: Within normal limits.

Spleen: Within normal limits for size. Maximal dimension is 12.6 cm
in the oblique sagittal plane (series 6/ image 94). No discrete
splenic mass is seen.

Adrenals/Urinary Tract: Adrenal glands are within normal limits.

Kidneys are within normal limits.  No hydronephrosis.

Stomach/Bowel: Stomach is within normal limits.

Visualized bowel is grossly unremarkable.

Vascular/Lymphatic: No evidence of abdominal aortic aneurysm.

No suspicious abdominal lymphadenopathy.

Other: No abdominal ascites.

Musculoskeletal: Mild degenerative changes of the lower lumbar
spine.
IMPRESSION: Spleen is within normal limits for size, measuring 12.6 cm in
maximal dimension. No discrete splenic mass is seen.

Otherwise unremarkable CT abdomen.

Please note that the pelvis was not imaged.

## 2018-06-11 ENCOUNTER — Inpatient Hospital Stay: Payer: BC Managed Care – PPO | Admitting: Internal Medicine

## 2018-06-11 ENCOUNTER — Inpatient Hospital Stay: Payer: BC Managed Care – PPO

## 2018-06-11 NOTE — Progress Notes (Deleted)
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   # MILD CHRONIC NEUTROPHILIA- [?2006]  # Congenital Bilateral Absent Radii   No history exists.     HISTORY OF PRESENTING ILLNESS:  Kent Gordon 42 y.o.  male very pleasant, with a history of congenital bilateral absent radius and long-standing history of thrombocytopenia/mild neutrophilia- Is here for follow-up.  Patient denies any bleeding. Denies any petechial rash. No weight loss or loss of appetite or night sweats.  ROS: A complete 10 point review of system is done which is negative except mentioned above in history of present illness  MEDICAL HISTORY:  Past Medical History:  Diagnosis Date  . Hemorrhoids   . Hypothyroidism   . Obstructive sleep apnea on CPAP   . Sleep apnea   . Thrombocytopenia (HCC)     SURGICAL HISTORY: Past Surgical History:  Procedure Laterality Date  . Radius surgeries x 6    . Right ACL surgery  2012    SOCIAL HISTORY: ocassional alochol; no smoking; assistant principal; Cheree Ditto, Please golf Social History   Socioeconomic History  . Marital status: Married    Spouse name: Not on file  . Number of children: Not on file  . Years of education: Not on file  . Highest education level: Not on file  Occupational History  . Not on file  Social Needs  . Financial resource strain: Not on file  . Food insecurity:    Worry: Not on file    Inability: Not on file  . Transportation needs:    Medical: Not on file    Non-medical: Not on file  Tobacco Use  . Smoking status: Never Smoker  . Smokeless tobacco: Never Used  Substance and Sexual Activity  . Alcohol use: No  . Drug use: No  . Sexual activity: Not on file  Lifestyle  . Physical  activity:    Days per week: Not on file    Minutes per session: Not on file  . Stress: Not on file  Relationships  . Social connections:    Talks on phone: Not on file    Gets together: Not on file    Attends religious service: Not on file    Active member of club or organization: Not on file    Attends meetings of clubs or organizations: Not on file    Relationship status: Not on file  . Intimate partner violence:    Fear of current or ex partner: Not on file    Emotionally abused: Not on file    Physically abused: Not on file    Forced sexual activity: Not on file  Other Topics Concern  . Not on file  Social History Narrative  . Not on file    FAMILY HISTORY: grandma/mat- low platelets.  No family history on file.  ALLERGIES:  has No Known Allergies.  MEDICATIONS:  Current Outpatient Medications  Medication Sig Dispense Refill  . Levothyroxine Sodium 75 MCG CAPS Take 1 tablet by mouth every morning.     No current facility-administered medications for this visit.       Marland Kitchen  PHYSICAL EXAMINATION: ECOG PERFORMANCE STATUS: 0 - Asymptomatic  There were no vitals filed for this visit. There were no vitals filed for this visit.  GENERAL: Well-nourished well-developed; Alert,  no distress and comfortable.   Alone.  EYES: no pallor or icterus OROPHARYNX: no thrush or ulceration; good dentition  NECK: supple, no masses felt LYMPH:  no palpable lymphadenopathy in the cervical, axillary or inguinal regions LUNGS: clear to auscultation and  No wheeze or crackles HEART/CVS: regular rate & rhythm and no murmurs; No lower extremity edema ABDOMEN: abdomen soft, non-tender and normal bowel sounds Musculoskeletal:no cyanosis of digits and no clubbing; has bilateral upper extremity deformities PSYCH: alert & oriented x 3 with fluent speech NEURO: no focal motor/sensory deficits SKIN:  no rashes or significant lesions  LABORATORY DATA:  I have reviewed the data as listed Lab  Results  Component Value Date   WBC 13.0 (H) 06/09/2017   HGB 14.7 06/09/2017   HCT 42.6 06/09/2017   MCV 85.8 06/09/2017   PLT 110 (L) 06/09/2017   No results for input(s): NA, K, CL, CO2, GLUCOSE, BUN, CREATININE, CALCIUM, GFRNONAA, GFRAA, PROT, ALBUMIN, AST, ALT, ALKPHOS, BILITOT, BILIDIR, IBILI in the last 8760 hours.  RADIOGRAPHIC STUDIES: I have personally reviewed the radiological images as listed and agreed with the findings in the report. No results found.  ASSESSMENT & PLAN:   No problem-specific Assessment & Plan notes found for this encounter.     Earna Coder, MD 06/11/2018 8:49 AM

## 2018-06-11 NOTE — Assessment & Plan Note (Deleted)
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 Platelets today- 98/ no significant decline.  CT scan negative for cirrhosis or splenomegaly.  # CHRONIC Mild Leucocytosis/neutrophilia- WBC 13- not increasing; monitor for now.   # Discussed regarding bone marrow biopsy; however would not recommend as patient is currently asymptomatic/and platelets are stable. A trial of steroids might also be reasonable prior to bone marrow biopsy.  # Follow-up CBC/MD in 12 m;  He will call us sooner if the platelets drop.

## 2019-10-13 ENCOUNTER — Ambulatory Visit: Payer: BC Managed Care – PPO | Attending: Internal Medicine

## 2019-10-13 DIAGNOSIS — Z23 Encounter for immunization: Secondary | ICD-10-CM | POA: Insufficient documentation

## 2019-10-13 NOTE — Progress Notes (Signed)
   Covid-19 Vaccination Clinic  Name:  Kent Gordon    MRN: [id number] DOB: 03/21/1976  10/13/2019  [name] was observed post Covid-19 immunization for 15 minutes without incident. He was provided with Vaccine Information Sheet and instruction to access the V-Safe system.   [name] was instructed to call 911 with any severe reactions post vaccine: Marland Kitchen Difficulty breathing  . Swelling of face and throat  . A fast heartbeat  . A bad rash all over body  . Dizziness and weakness   Immunizations Administered    Name Date Dose VIS Date Route   Pfizer COVID-19 Vaccine 10/13/2019  9:19 AM 0.3 mL 07/19/2019 Intramuscular   Manufacturer: [employer], [employer]   Lot: [id number]   NDC: [address]-0

## 2019-10-25 ENCOUNTER — Telehealth: Payer: Self-pay | Admitting: *Deleted

## 2019-10-25 ENCOUNTER — Encounter: Payer: Self-pay | Admitting: Internal Medicine

## 2019-10-25 DIAGNOSIS — D696 Thrombocytopenia, unspecified: Secondary | ICD-10-CM

## 2019-10-25 NOTE — Telephone Encounter (Signed)
-----   Message from Earna Coder, MD sent at 10/25/2019 11:22 AM EDT ----- Regarding: RE: 10/24/2019 REFERRAL FROM [facility] Follow up next week- MD: labs- cbc/cmp/LDH- Dr.B ----- Message ----- From: Jarvis Morgan Sent: 10/25/2019  10:34 AM EDT To: Harvie Bridge, CMA, Synetta Shadow, # Subject: 10/24/2019 REFERRAL FROM [facility]        Team,   We received a referral from [facility] to reestablish care with the attached patient.  Last seen 06/2017, referral is on the media tab for review.  Thanks, [employer]

## 2019-10-28 ENCOUNTER — Other Ambulatory Visit: Payer: BC Managed Care – PPO

## 2019-10-28 ENCOUNTER — Ambulatory Visit: Payer: BC Managed Care – PPO | Admitting: Internal Medicine

## 2019-10-30 ENCOUNTER — Encounter: Payer: Self-pay | Admitting: Internal Medicine

## 2019-10-30 ENCOUNTER — Other Ambulatory Visit: Payer: Self-pay

## 2019-10-30 DIAGNOSIS — G4733 Obstructive sleep apnea (adult) (pediatric): Secondary | ICD-10-CM | POA: Insufficient documentation

## 2019-10-30 DIAGNOSIS — Z9989 Dependence on other enabling machines and devices: Secondary | ICD-10-CM | POA: Insufficient documentation

## 2019-10-30 NOTE — Progress Notes (Signed)
Patient pre screened for office appointment, no questions or concerns today. Patient reminded of upcoming appointment time and date. 

## 2019-10-31 ENCOUNTER — Inpatient Hospital Stay: Payer: BC Managed Care – PPO | Attending: Internal Medicine

## 2019-10-31 ENCOUNTER — Inpatient Hospital Stay: Payer: BC Managed Care – PPO | Admitting: Internal Medicine

## 2019-10-31 ENCOUNTER — Encounter: Payer: Self-pay | Admitting: Internal Medicine

## 2019-10-31 DIAGNOSIS — E039 Hypothyroidism, unspecified: Secondary | ICD-10-CM | POA: Diagnosis not present

## 2019-10-31 DIAGNOSIS — D696 Thrombocytopenia, unspecified: Secondary | ICD-10-CM | POA: Diagnosis present

## 2019-10-31 DIAGNOSIS — D72829 Elevated white blood cell count, unspecified: Secondary | ICD-10-CM | POA: Insufficient documentation

## 2019-10-31 LAB — CBC WITH DIFFERENTIAL/PLATELET
Abs Immature Granulocytes: 0.12 10*3/uL — ABNORMAL HIGH (ref 0.00–0.07)
Basophils Absolute: 0.1 10*3/uL (ref 0.0–0.1)
Basophils Relative: 1 %
Eosinophils Absolute: 0.4 10*3/uL (ref 0.0–0.5)
Eosinophils Relative: 3 %
HCT: 42.2 % (ref 39.0–52.0)
Hemoglobin: 14.7 g/dL (ref 13.0–17.0)
Immature Granulocytes: 1 %
Lymphocytes Relative: 20 %
Lymphs Abs: 2.7 10*3/uL (ref 0.7–4.0)
MCH: 30 pg (ref 26.0–34.0)
MCHC: 34.8 g/dL (ref 30.0–36.0)
MCV: 86.1 fL (ref 80.0–100.0)
Monocytes Absolute: 0.6 10*3/uL (ref 0.1–1.0)
Monocytes Relative: 5 %
Neutro Abs: 9.5 10*3/uL — ABNORMAL HIGH (ref 1.7–7.7)
Neutrophils Relative %: 70 %
Platelets: 69 10*3/uL — ABNORMAL LOW (ref 150–400)
RBC: 4.9 MIL/uL (ref 4.22–5.81)
RDW: 15.2 % (ref 11.5–15.5)
WBC: 13.5 10*3/uL — ABNORMAL HIGH (ref 4.0–10.5)
nRBC: 0.1 % (ref 0.0–0.2)

## 2019-10-31 LAB — COMPREHENSIVE METABOLIC PANEL
ALT: 20 U/L (ref 0–44)
AST: 17 U/L (ref 15–41)
Albumin: 4.4 g/dL (ref 3.5–5.0)
Alkaline Phosphatase: 55 U/L (ref 38–126)
Anion gap: 8 (ref 5–15)
BUN: 20 mg/dL (ref 6–20)
CO2: 23 mmol/L (ref 22–32)
Calcium: 8.8 mg/dL — ABNORMAL LOW (ref 8.9–10.3)
Chloride: 106 mmol/L (ref 98–111)
Creatinine, Ser: 0.82 mg/dL (ref 0.61–1.24)
GFR calc Af Amer: >60 mL/min (ref >60)
GFR calc non Af Amer: >60 mL/min (ref >60)
Glucose, Bld: 121 mg/dL — ABNORMAL HIGH (ref 70–99)
Potassium: 3.9 mmol/L (ref 3.5–5.1)
Sodium: 137 mmol/L (ref 135–145)
Total Bilirubin: 1.4 mg/dL — ABNORMAL HIGH (ref 0.3–1.2)
Total Protein: 6.7 g/dL (ref 6.5–8.1)

## 2019-10-31 LAB — LACTATE DEHYDROGENASE: LDH: 97 U/L — ABNORMAL LOW (ref 98–192)

## 2019-10-31 NOTE — Assessment & Plan Note (Addendum)
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 Platelets today- 60s/slow trend downwards.  CT scan negative for cirrhosis or splenomegaly.  # CHRONIC Mild Leucocytosis/neutrophilia- WBC 13- not increasing; monitor for now.   #As patient continues to be symptomatic-I think it is reasonable to monitor for now.  If symptomatic with bleeding/easy bruising-or platelet less than 50 would recommend trial of steroids.  If refractory would consider bone marrow biopsy.  # DISPOSITION: # monthly- cbc x3  # follow up in 3 months- MD; labs- cbc-Dr.B  Cc; Dr.L

## 2019-10-31 NOTE — Progress Notes (Signed)
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   # MILD CHRONIC NEUTROPHILIA- [?2951]  # Congenital Bilateral Absent Radii  Oncology History   No history exists.     HISTORY OF PRESENTING ILLNESS:  Kent Gordon 44 y.o.  male very pleasant, with a history of congenital bilateral absent radius and long-standing history of thrombocytopenia/mild neutrophilia- Is here for follow-up.  He has been referred to [place] as his platelets have been running around 60s more recently.  Patient denies any spontaneous bleeding.  Notes to have a flareup of hemorrhoids-currently resolved.   Review of Systems  Constitutional: Negative for chills, diaphoresis, fever, malaise/fatigue and weight loss.  HENT: Negative for nosebleeds and sore throat.   Eyes: Negative for double vision.  Respiratory: Negative for cough, hemoptysis, sputum production, shortness of breath and wheezing.   Cardiovascular: Negative for chest pain, palpitations, orthopnea and leg swelling.  Gastrointestinal: Negative for abdominal pain, blood in stool, constipation, diarrhea, heartburn, melena, nausea and vomiting.  Genitourinary: Negative for dysuria, frequency and urgency.  Musculoskeletal: Negative for back pain and joint pain.  Skin: Negative.  Negative for itching and rash.  Neurological: Negative for dizziness, tingling, focal weakness, weakness and headaches.  Endo/Heme/Allergies: Does not bruise/bleed easily.  Psychiatric/Behavioral: Negative for depression. The patient is not nervous/anxious and does not have insomnia.      MEDICAL HISTORY:  Past Medical History:  Diagnosis Date  . Hemorrhoids   . Hypothyroidism   . Obstructive sleep apnea on CPAP   . Sleep apnea   .  Thrombocytopenia ([place])     SURGICAL HISTORY: Past Surgical History:  Procedure Laterality Date  . Radius surgeries x 6    . Right ACL surgery  2012    SOCIAL HISTORY:  Social History   Socioeconomic History  . Marital status: Married    Spouse name: Not on file  . Number of children: Not on file  . Years of education: Not on file  . Highest education level: Not on file  Occupational History  . Not on file  Tobacco Use  . Smoking status: Never Smoker  . Smokeless tobacco: Never Used  Substance and Sexual Activity  . Alcohol use: No  . Drug use: No  . Sexual activity: Not on file  Other Topics Concern  . Not on file  Social History Narrative   ocassional alochol; no smoking; Environmental consultant principal; [name], Plays golf.    Social Determinants of Health   Financial Resource Strain:   . Difficulty of Paying Living Expenses:   Food Insecurity:   . Worried About Charity fundraiser in the Last Year:   . Arboriculturist in the Last Year:   Transportation Needs:   . Film/video editor (Medical):   Marland Kitchen Lack of Transportation (Non-Medical):   Physical Activity:   . Days of Exercise per Week:   . Minutes of Exercise per Session:   Stress:   . Feeling of Stress :   Social Connections:   . Frequency of Communication with Friends and Family:   . Frequency of Social Gatherings with Friends and Family:   . Attends Religious Services:   . Active Member of Clubs or Organizations:   . Attends Archivist Meetings:   Marland Kitchen Marital Status:   Intimate  Partner Violence:   . Fear of Current or Ex-Partner:   . Emotionally Abused:   Marland Kitchen Physically Abused:   . Sexually Abused:     FAMILY HISTORY: grandma/mat- low platelets.  History reviewed. No pertinent family history.  ALLERGIES:  has No Known Allergies.  MEDICATIONS:  Current Outpatient Medications  Medication Sig Dispense Refill  . levothyroxine (SYNTHROID) 88 MCG tablet Take 1 tablet by mouth daily.     No current  facility-administered medications for this visit.      Marland Kitchen  PHYSICAL EXAMINATION: ECOG PERFORMANCE STATUS: 0 - Asymptomatic  Vitals:   10/30/19 1002  BP: 124/77  Pulse: 90  Temp: (!) 96.8 F (36 C)   Filed Weights   10/30/19 1002  Weight: 227 lb (103 kg)    Physical Exam  Constitutional: He is oriented to person, place, and time and well-developed, well-nourished, and in no distress.  HENT:  Head: Normocephalic and atraumatic.  Mouth/Throat: Oropharynx is clear and moist. No oropharyngeal exudate.  Eyes: Pupils are equal, round, and reactive to light.  Cardiovascular: Normal rate and regular rhythm.  Pulmonary/Chest: No respiratory distress. He has no wheezes.  Abdominal: Soft. Bowel sounds are normal. He exhibits no distension and no mass. There is no abdominal tenderness. There is no rebound and no guarding.  Musculoskeletal:        General: No tenderness or edema. Normal range of motion.     Cervical back: Normal range of motion and neck supple.  Neurological: He is alert and oriented to person, place, and time.  Skin: Skin is warm.  Psychiatric: Affect normal.     LABORATORY DATA:  I have reviewed the data as listed Lab Results  Component Value Date   WBC 13.5 (H) 10/31/2019   HGB 14.7 10/31/2019   HCT 42.2 10/31/2019   MCV 86.1 10/31/2019   PLT 69 (L) 10/31/2019   Recent Labs    10/31/19 0936  NA 137  K 3.9  CL 106  CO2 23  GLUCOSE 121*  BUN 20  CREATININE 0.82  CALCIUM 8.8*  GFRNONAA >60  GFRAA >60  PROT 6.7  ALBUMIN 4.4  AST 17  ALT 20  ALKPHOS 55  BILITOT 1.4*    RADIOGRAPHIC STUDIES: I have personally reviewed the radiological images as listed and agreed with the findings in the report. No results found.  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 Platelets today- 60s/slow trend downwards.  CT scan negative for cirrhosis or splenomegaly.  # CHRONIC Mild  Leucocytosis/neutrophilia- WBC 13- not increasing; monitor for now.   #As patient continues to be symptomatic-I think it is reasonable to monitor for now.  If symptomatic with bleeding/easy bruising-or platelet less than 50 would recommend trial of steroids.  If refractory would consider bone marrow biopsy.  # DISPOSITION: # monthly- cbc x3  # follow up in 3 months- MD; labs- cbc-Dr.B  Cc; Dr.L     Cammie Sickle, MD 11/07/2019 7:48 AM

## 2019-11-06 ENCOUNTER — Ambulatory Visit: Payer: BC Managed Care – PPO | Attending: Internal Medicine

## 2019-11-06 ENCOUNTER — Other Ambulatory Visit: Payer: Self-pay

## 2019-11-06 DIAGNOSIS — Z23 Encounter for immunization: Secondary | ICD-10-CM

## 2019-11-06 NOTE — Progress Notes (Signed)
   Covid-19 Vaccination Clinic  Name:  SHADE KALEY    MRN: [id number] DOB: 11/08/1975  11/06/2019  Mr. Fildes was observed post Covid-19 immunization for 15 minutes without incident. He was provided with Vaccine Information Sheet and instruction to access the V-Safe system.   Mr. Desena was instructed to call 911 with any severe reactions post vaccine: Marland Kitchen Difficulty breathing  . Swelling of face and throat  . A fast heartbeat  . A bad rash all over body  . Dizziness and weakness   Immunizations Administered    Name Date Dose VIS Date Route   Pfizer COVID-19 Vaccine 11/06/2019  8:30 AM 0.3 mL 07/19/2019 Intramuscular   Manufacturer: [employer], [employer]   Lot: [id number]   NDC: [address]-7

## 2019-11-28 ENCOUNTER — Inpatient Hospital Stay: Payer: BC Managed Care – PPO | Attending: Internal Medicine

## 2019-11-28 ENCOUNTER — Encounter: Payer: Self-pay | Admitting: Internal Medicine

## 2019-11-28 ENCOUNTER — Other Ambulatory Visit: Payer: Self-pay

## 2019-11-28 DIAGNOSIS — D696 Thrombocytopenia, unspecified: Secondary | ICD-10-CM | POA: Diagnosis present

## 2019-11-28 LAB — CBC WITH DIFFERENTIAL/PLATELET
Abs Immature Granulocytes: 0.08 10*3/uL — ABNORMAL HIGH (ref 0.00–0.07)
Basophils Absolute: 0.1 10*3/uL (ref 0.0–0.1)
Basophils Relative: 1 %
Eosinophils Absolute: 0.5 10*3/uL (ref 0.0–0.5)
Eosinophils Relative: 3 %
HCT: 45.4 % (ref 39.0–52.0)
Hemoglobin: 15.5 g/dL (ref 13.0–17.0)
Immature Granulocytes: 1 %
Lymphocytes Relative: 19 %
Lymphs Abs: 3.1 10*3/uL (ref 0.7–4.0)
MCH: 29.6 pg (ref 26.0–34.0)
MCHC: 34.1 g/dL (ref 30.0–36.0)
MCV: 86.6 fL (ref 80.0–100.0)
Monocytes Absolute: 0.7 10*3/uL (ref 0.1–1.0)
Monocytes Relative: 5 %
Neutro Abs: 11.6 10*3/uL — ABNORMAL HIGH (ref 1.7–7.7)
Neutrophils Relative %: 71 %
Platelets: 68 10*3/uL — ABNORMAL LOW (ref 150–400)
RBC: 5.24 MIL/uL (ref 4.22–5.81)
RDW: 14.8 % (ref 11.5–15.5)
WBC: 16.2 10*3/uL — ABNORMAL HIGH (ref 4.0–10.5)
nRBC: 0 % (ref 0.0–0.2)

## 2019-12-26 ENCOUNTER — Other Ambulatory Visit: Payer: BC Managed Care – PPO

## 2019-12-27 ENCOUNTER — Other Ambulatory Visit: Payer: Self-pay

## 2019-12-27 ENCOUNTER — Inpatient Hospital Stay: Payer: BC Managed Care – PPO | Attending: Internal Medicine

## 2019-12-27 DIAGNOSIS — D696 Thrombocytopenia, unspecified: Secondary | ICD-10-CM | POA: Diagnosis present

## 2019-12-27 LAB — CBC WITH DIFFERENTIAL/PLATELET
Abs Immature Granulocytes: 0.07 10*3/uL (ref 0.00–0.07)
Basophils Absolute: 0.1 10*3/uL (ref 0.0–0.1)
Basophils Relative: 1 %
Eosinophils Absolute: 0.4 10*3/uL (ref 0.0–0.5)
Eosinophils Relative: 3 %
HCT: 42.7 % (ref 39.0–52.0)
Hemoglobin: 14.8 g/dL (ref 13.0–17.0)
Immature Granulocytes: 1 %
Lymphocytes Relative: 23 %
Lymphs Abs: 3.1 10*3/uL (ref 0.7–4.0)
MCH: 29.8 pg (ref 26.0–34.0)
MCHC: 34.7 g/dL (ref 30.0–36.0)
MCV: 85.9 fL (ref 80.0–100.0)
Monocytes Absolute: 0.6 10*3/uL (ref 0.1–1.0)
Monocytes Relative: 5 %
Neutro Abs: 9.3 10*3/uL — ABNORMAL HIGH (ref 1.7–7.7)
Neutrophils Relative %: 67 %
Platelets: 98 10*3/uL — ABNORMAL LOW (ref 150–400)
RBC: 4.97 MIL/uL (ref 4.22–5.81)
RDW: 14.6 % (ref 11.5–15.5)
WBC: 13.6 10*3/uL — ABNORMAL HIGH (ref 4.0–10.5)
nRBC: 0 % (ref 0.0–0.2)

## 2020-01-23 ENCOUNTER — Inpatient Hospital Stay: Payer: BC Managed Care – PPO | Admitting: Internal Medicine

## 2020-01-23 ENCOUNTER — Other Ambulatory Visit: Payer: Self-pay

## 2020-01-23 ENCOUNTER — Encounter: Payer: Self-pay | Admitting: Internal Medicine

## 2020-01-23 ENCOUNTER — Inpatient Hospital Stay: Payer: BC Managed Care – PPO | Attending: Internal Medicine

## 2020-01-23 DIAGNOSIS — Z79899 Other long term (current) drug therapy: Secondary | ICD-10-CM | POA: Insufficient documentation

## 2020-01-23 DIAGNOSIS — E039 Hypothyroidism, unspecified: Secondary | ICD-10-CM | POA: Insufficient documentation

## 2020-01-23 DIAGNOSIS — D696 Thrombocytopenia, unspecified: Secondary | ICD-10-CM

## 2020-01-23 DIAGNOSIS — D72829 Elevated white blood cell count, unspecified: Secondary | ICD-10-CM | POA: Insufficient documentation

## 2020-01-23 DIAGNOSIS — G4733 Obstructive sleep apnea (adult) (pediatric): Secondary | ICD-10-CM | POA: Diagnosis not present

## 2020-01-23 LAB — CBC WITH DIFFERENTIAL/PLATELET
Abs Immature Granulocytes: 0.08 10*3/uL — ABNORMAL HIGH (ref 0.00–0.07)
Basophils Absolute: 0.2 10*3/uL — ABNORMAL HIGH (ref 0.0–0.1)
Basophils Relative: 1 %
Eosinophils Absolute: 0.4 10*3/uL (ref 0.0–0.5)
Eosinophils Relative: 3 %
HCT: 43.5 % (ref 39.0–52.0)
Hemoglobin: 15.1 g/dL (ref 13.0–17.0)
Immature Granulocytes: 1 %
Lymphocytes Relative: 23 %
Lymphs Abs: 3.1 10*3/uL (ref 0.7–4.0)
MCH: 29.6 pg (ref 26.0–34.0)
MCHC: 34.7 g/dL (ref 30.0–36.0)
MCV: 85.3 fL (ref 80.0–100.0)
Monocytes Absolute: 0.6 10*3/uL (ref 0.1–1.0)
Monocytes Relative: 5 %
Neutro Abs: 9.5 10*3/uL — ABNORMAL HIGH (ref 1.7–7.7)
Neutrophils Relative %: 67 %
Platelets: 93 10*3/uL — ABNORMAL LOW (ref 150–400)
RBC: 5.1 MIL/uL (ref 4.22–5.81)
RDW: 14.9 % (ref 11.5–15.5)
WBC: 13.9 10*3/uL — ABNORMAL HIGH (ref 4.0–10.5)
nRBC: 0 % (ref 0.0–0.2)

## 2020-01-23 NOTE — Progress Notes (Signed)
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   # MILD CHRONIC NEUTROPHILIA- [?2006]  # Congenital Bilateral Absent Radii  Oncology History   No history exists.     HISTORY OF PRESENTING ILLNESS:  Kent Gordon 44 y.o.  male very pleasant, with a history of congenital bilateral absent radius and long-standing history of thrombocytopenia/mild neutrophilia- Is here for follow-up.    Patient's platelets have been in the range of 60s to 90s.  Asymptomatic.  Denies any bleeding gums or nose.  Review of Systems  Constitutional: Negative for chills, diaphoresis, fever, malaise/fatigue and weight loss.  HENT: Negative for nosebleeds and sore throat.   Eyes: Negative for double vision.  Respiratory: Negative for cough, hemoptysis, sputum production, shortness of breath and wheezing.   Cardiovascular: Negative for chest pain, palpitations, orthopnea and leg swelling.  Gastrointestinal: Negative for abdominal pain, blood in stool, constipation, diarrhea, heartburn, melena, nausea and vomiting.  Genitourinary: Negative for dysuria, frequency and urgency.  Musculoskeletal: Negative for back pain and joint pain.  Skin: Negative.  Negative for itching and rash.  Neurological: Negative for dizziness, tingling, focal weakness, weakness and headaches.  Endo/Heme/Allergies: Does not bruise/bleed easily.  Psychiatric/Behavioral: Negative for depression. The patient is not nervous/anxious and does not have insomnia.      MEDICAL HISTORY:  Past Medical History:  Diagnosis Date  . Hemorrhoids   . Hypothyroidism   . Obstructive sleep apnea on CPAP   . Sleep apnea   . Thrombocytopenia (HCC)     SURGICAL HISTORY: Past Surgical History:   Procedure Laterality Date  . Radius surgeries x 6    . Right ACL surgery  2012    SOCIAL HISTORY:  Social History   Socioeconomic History  . Marital status: Married    Spouse name: Not on file  . Number of children: Not on file  . Years of education: Not on file  . Highest education level: Not on file  Occupational History  . Not on file  Tobacco Use  . Smoking status: Never Smoker  . Smokeless tobacco: Never Used  Substance and Sexual Activity  . Alcohol use: No  . Drug use: No  . Sexual activity: Not on file  Other Topics Concern  . Not on file  Social History Narrative   ocassional alochol; no smoking; Geophysicist/field seismologist principal; Cheree Ditto, Plays golf.    Social Determinants of Health   Financial Resource Strain:   . Difficulty of Paying Living Expenses:   Food Insecurity:   . Worried About Programme researcher, broadcasting/film/video in the Last Year:   . Barista in the Last Year:   Transportation Needs:   . Freight forwarder (Medical):   Marland Kitchen Lack of Transportation (Non-Medical):   Physical Activity:   . Days of Exercise per Week:   . Minutes of Exercise per Session:   Stress:   . Feeling of Stress :   Social Connections:   . Frequency of Communication with Friends and Family:   . Frequency of Social Gatherings with Friends and Family:   . Attends Religious Services:   . Active Member of Clubs or Organizations:   . Attends Banker Meetings:   Marland Kitchen Marital Status:   Intimate Partner Violence:   . Fear of Current or Ex-Partner:   .  Emotionally Abused:   Marland Kitchen Physically Abused:   . Sexually Abused:     FAMILY HISTORY: grandma/mat- low platelets.  No family history on file.  ALLERGIES:  has No Known Allergies.  MEDICATIONS:  Current Outpatient Medications  Medication Sig Dispense Refill  . levothyroxine (SYNTHROID) 88 MCG tablet Take 1 tablet by mouth daily.     No current facility-administered medications for this visit.      Marland Kitchen  PHYSICAL EXAMINATION: ECOG  PERFORMANCE STATUS: 0 - Asymptomatic  Vitals:   01/23/20 1046  BP: 111/84  Pulse: 98  Resp: 16  Temp: (!) 97.5 F (36.4 C)  SpO2: 99%   Filed Weights   01/23/20 1046  Weight: 225 lb (102.1 kg)    Physical Exam HENT:     Head: Normocephalic and atraumatic.     Mouth/Throat:     Pharynx: No oropharyngeal exudate.  Eyes:     Pupils: Pupils are equal, round, and reactive to light.  Cardiovascular:     Rate and Rhythm: Normal rate and regular rhythm.  Pulmonary:     Effort: No respiratory distress.     Breath sounds: No wheezing.  Abdominal:     General: Bowel sounds are normal. There is no distension.     Palpations: Abdomen is soft. There is no mass.     Tenderness: There is no abdominal tenderness. There is no guarding or rebound.  Musculoskeletal:        General: Deformity present. No tenderness. Normal range of motion.     Cervical back: Normal range of motion and neck supple.     Comments: Congenital deformity bilateral upper extremities.  Skin:    General: Skin is warm.  Neurological:     Mental Status: Kent Gordon is alert and oriented to person, place, and time.  Psychiatric:        Mood and Affect: Affect normal.      LABORATORY DATA:  I have reviewed the data as listed Lab Results  Component Value Date   WBC 13.9 (H) 01/23/2020   HGB 15.1 01/23/2020   HCT 43.5 01/23/2020   MCV 85.3 01/23/2020   PLT 93 (L) 01/23/2020   Recent Labs    10/31/19 0936  NA 137  K 3.9  CL 106  CO2 23  GLUCOSE 121*  BUN 20  CREATININE 0.82  CALCIUM 8.8*  GFRNONAA >60  GFRAA >60  PROT 6.7  ALBUMIN 4.4  AST 17  ALT 20  ALKPHOS 55  BILITOT 1.4*    RADIOGRAPHIC STUDIES: I have personally reviewed the radiological images as listed and agreed with the findings in the report. No results found.  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 Platelets today- 90s; overall stable; continue surveillaince  # CHRONIC Mild  Leucocytosis/neutrophilia- WBC 13- not increasing; STABLE.  # DISPOSITION: # monthly- cbc every 2 months x3 # follow up in 6 months- MD; labs- cbc/bmp-Dr.B  Cc; Dr.L     Cammie Sickle, MD 01/30/2020 8:06 AM

## 2020-01-23 NOTE — Assessment & Plan Note (Addendum)
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 Platelets today- 90s; overall stable; continue surveillaince  # CHRONIC Mild Leucocytosis/neutrophilia- WBC 13- not increasing; STABLE.  # DISPOSITION: # monthly- cbc every 2 months x3 # follow up in 6 months- MD; labs- cbc/bmp-Dr.B  Cc; Dr.L

## 2020-03-26 ENCOUNTER — Inpatient Hospital Stay: Payer: BC Managed Care – PPO | Attending: Internal Medicine

## 2020-05-28 ENCOUNTER — Inpatient Hospital Stay: Payer: BC Managed Care – PPO | Attending: Internal Medicine

## 2020-05-28 ENCOUNTER — Other Ambulatory Visit: Payer: Self-pay

## 2020-05-28 DIAGNOSIS — D696 Thrombocytopenia, unspecified: Secondary | ICD-10-CM | POA: Insufficient documentation

## 2020-05-28 LAB — CBC WITH DIFFERENTIAL/PLATELET
Abs Immature Granulocytes: 0.08 10*3/uL — ABNORMAL HIGH (ref 0.00–0.07)
Basophils Absolute: 0.1 10*3/uL (ref 0.0–0.1)
Basophils Relative: 1 %
Eosinophils Absolute: 0.3 10*3/uL (ref 0.0–0.5)
Eosinophils Relative: 2 %
HCT: 44.2 % (ref 39.0–52.0)
Hemoglobin: 15.3 g/dL (ref 13.0–17.0)
Immature Granulocytes: 1 %
Lymphocytes Relative: 20 %
Lymphs Abs: 3.2 10*3/uL (ref 0.7–4.0)
MCH: 29.1 pg (ref 26.0–34.0)
MCHC: 34.6 g/dL (ref 30.0–36.0)
MCV: 84 fL (ref 80.0–100.0)
Monocytes Absolute: 0.8 10*3/uL (ref 0.1–1.0)
Monocytes Relative: 5 %
Neutro Abs: 12 10*3/uL — ABNORMAL HIGH (ref 1.7–7.7)
Neutrophils Relative %: 71 %
Platelets: 110 10*3/uL — ABNORMAL LOW (ref 150–400)
RBC: 5.26 MIL/uL (ref 4.22–5.81)
RDW: 14.5 % (ref 11.5–15.5)
WBC: 16.5 10*3/uL — ABNORMAL HIGH (ref 4.0–10.5)
nRBC: 0 % (ref 0.0–0.2)

## 2020-07-23 ENCOUNTER — Inpatient Hospital Stay: Payer: BC Managed Care – PPO | Attending: Internal Medicine

## 2020-07-23 ENCOUNTER — Encounter: Payer: Self-pay | Admitting: Internal Medicine

## 2020-07-23 ENCOUNTER — Inpatient Hospital Stay (HOSPITAL_BASED_OUTPATIENT_CLINIC_OR_DEPARTMENT_OTHER): Payer: BC Managed Care – PPO | Admitting: Internal Medicine

## 2020-07-23 DIAGNOSIS — Q7143 Longitudinal reduction defect of radius, bilateral: Secondary | ICD-10-CM | POA: Diagnosis not present

## 2020-07-23 DIAGNOSIS — D696 Thrombocytopenia, unspecified: Secondary | ICD-10-CM | POA: Insufficient documentation

## 2020-07-23 DIAGNOSIS — D72829 Elevated white blood cell count, unspecified: Secondary | ICD-10-CM | POA: Insufficient documentation

## 2020-07-23 LAB — CBC WITH DIFFERENTIAL/PLATELET
Abs Immature Granulocytes: 0.07 10*3/uL (ref 0.00–0.07)
Basophils Absolute: 0.1 10*3/uL (ref 0.0–0.1)
Basophils Relative: 1 %
Eosinophils Absolute: 0.3 10*3/uL (ref 0.0–0.5)
Eosinophils Relative: 3 %
HCT: 42.9 % (ref 39.0–52.0)
Hemoglobin: 14.9 g/dL (ref 13.0–17.0)
Immature Granulocytes: 1 %
Lymphocytes Relative: 18 %
Lymphs Abs: 2.4 10*3/uL (ref 0.7–4.0)
MCH: 29.4 pg (ref 26.0–34.0)
MCHC: 34.7 g/dL (ref 30.0–36.0)
MCV: 84.8 fL (ref 80.0–100.0)
Monocytes Absolute: 0.6 10*3/uL (ref 0.1–1.0)
Monocytes Relative: 4 %
Neutro Abs: 9.8 10*3/uL — ABNORMAL HIGH (ref 1.7–7.7)
Neutrophils Relative %: 73 %
Platelets: 86 10*3/uL — ABNORMAL LOW (ref 150–400)
RBC: 5.06 MIL/uL (ref 4.22–5.81)
RDW: 14.9 % (ref 11.5–15.5)
WBC: 13.3 10*3/uL — ABNORMAL HIGH (ref 4.0–10.5)
nRBC: 0 % (ref 0.0–0.2)

## 2020-07-23 LAB — BASIC METABOLIC PANEL
Anion gap: 10 (ref 5–15)
BUN: 17 mg/dL (ref 6–20)
CO2: 20 mmol/L — ABNORMAL LOW (ref 22–32)
Calcium: 8.6 mg/dL — ABNORMAL LOW (ref 8.9–10.3)
Chloride: 104 mmol/L (ref 98–111)
Creatinine, Ser: 0.81 mg/dL (ref 0.61–1.24)
GFR, Estimated: >60 mL/min (ref >60)
Glucose, Bld: 115 mg/dL — ABNORMAL HIGH (ref 70–99)
Potassium: 3.8 mmol/L (ref 3.5–5.1)
Sodium: 134 mmol/L — ABNORMAL LOW (ref 135–145)

## 2020-07-23 NOTE — Progress Notes (Signed)
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   # MILD CHRONIC NEUTROPHILIA- [?2006]  # Congenital Bilateral Absent Radii  Oncology History   No history exists.     HISTORY OF PRESENTING ILLNESS:  Kent Gordon 44 y.o.  male very pleasant, with a history of congenital bilateral absent radius and long-standing history of thrombocytopenia/mild neutrophilia- Is here for follow-up.    Patient continues to be asymptomatic.  Denies any bleeding or bruising.  Denies any chest pain or shortness of breath or cough.  No weight loss.  Review of Systems  Constitutional: Negative for chills, diaphoresis, fever, malaise/fatigue and weight loss.  HENT: Negative for nosebleeds and sore throat.   Eyes: Negative for double vision.  Respiratory: Negative for cough, hemoptysis, sputum production, shortness of breath and wheezing.   Cardiovascular: Negative for chest pain, palpitations, orthopnea and leg swelling.  Gastrointestinal: Negative for abdominal pain, blood in stool, constipation, diarrhea, heartburn, melena, nausea and vomiting.  Genitourinary: Negative for dysuria, frequency and urgency.  Musculoskeletal: Negative for back pain and joint pain.  Skin: Negative.  Negative for itching and rash.  Neurological: Negative for dizziness, tingling, focal weakness, weakness and headaches.  Endo/Heme/Allergies: Does not bruise/bleed easily.  Psychiatric/Behavioral: Negative for depression. The patient is not nervous/anxious and does not have insomnia.      MEDICAL HISTORY:  Past Medical History:  Diagnosis Date  . Hemorrhoids   . Hypothyroidism   . Obstructive sleep  apnea on CPAP   . Sleep apnea   . Thrombocytopenia (HCC)     SURGICAL HISTORY: Past Surgical History:  Procedure Laterality Date  . Radius surgeries x 6    . Right ACL surgery  2012    SOCIAL HISTORY:  Social History   Socioeconomic History  . Marital status: Married    Spouse name: Not on file  . Number of children: Not on file  . Years of education: Not on file  . Highest education level: Not on file  Occupational History  . Not on file  Tobacco Use  . Smoking status: Never Smoker  . Smokeless tobacco: Never Used  Substance and Sexual Activity  . Alcohol use: No  . Drug use: No  . Sexual activity: Not on file  Other Topics Concern  . Not on file  Social History Narrative   ocassional alochol; no smoking; Geophysicist/field seismologist principal; Cheree Ditto, Plays golf.    Social Determinants of Health   Financial Resource Strain: Not on file  Food Insecurity: Not on file  Transportation Needs: Not on file  Physical Activity: Not on file  Stress: Not on file  Social Connections: Not on file  Intimate Partner Violence: Not on file    FAMILY HISTORY: grandma/mat- low platelets.  No family history on file.  ALLERGIES:  has No Known Allergies.  MEDICATIONS:  Current Outpatient Medications  Medication Sig Dispense Refill  . levothyroxine (SYNTHROID) 88 MCG tablet Take 1 tablet by mouth daily.     No current facility-administered medications for this visit.      Marland Kitchen  PHYSICAL EXAMINATION: ECOG PERFORMANCE STATUS: 0 - Asymptomatic  Vitals:   07/23/20 1105  BP: 103/64  Pulse: 91  Temp: 98.5 F (36.9 C)  SpO2: 99%   Filed  Weights   07/23/20 1105  Weight: 223 lb 9.6 oz (101.4 kg)    Physical Exam HENT:     Head: Normocephalic and atraumatic.     Mouth/Throat:     Pharynx: No oropharyngeal exudate.  Eyes:     Pupils: Pupils are equal, round, and reactive to light.  Cardiovascular:     Rate and Rhythm: Normal rate and regular rhythm.  Pulmonary:     Effort: No  respiratory distress.     Breath sounds: No wheezing.  Abdominal:     General: Bowel sounds are normal. There is no distension.     Palpations: Abdomen is soft. There is no mass.     Tenderness: There is no abdominal tenderness. There is no guarding or rebound.  Musculoskeletal:        General: Deformity present. No tenderness. Normal range of motion.     Cervical back: Normal range of motion and neck supple.     Comments: Congenital deformity bilateral upper extremities.  Skin:    General: Skin is warm.  Neurological:     Mental Status: He is alert and oriented to person, place, and time.  Psychiatric:        Mood and Affect: Affect normal.      LABORATORY DATA:  I have reviewed the data as listed Lab Results  Component Value Date   WBC 13.3 (H) 07/23/2020   HGB 14.9 07/23/2020   HCT 42.9 07/23/2020   MCV 84.8 07/23/2020   PLT 86 (L) 07/23/2020   Recent Labs    10/31/19 0936 07/23/20 1003  NA 137 134*  K 3.9 3.8  CL 106 104  CO2 23 20*  GLUCOSE 121* 115*  BUN 20 17  CREATININE 0.82 0.81  CALCIUM 8.8* 8.6*  GFRNONAA >60 >60  GFRAA >60  --   PROT 6.7  --   ALBUMIN 4.4  --   AST 17  --   ALT 20  --   ALKPHOS 55  --   BILITOT 1.4*  --     RADIOGRAPHIC STUDIES: I have personally reviewed the radiological images as listed and agreed with the findings in the report. No results found.  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  Asymptomatic platelets today- 80-90s; overall stable; continue surveillaince; CBC [with PCP;TSH ] q 3 M.  No indication for therapy at this time  # CHRONIC Mild Leucocytosis/neutrophilia- WBC 13- not increasing; stable  # DISPOSITION: # follow up in 6 months- MD; labs- cbc/bmp-Dr.B  Cc; Dr.L     Earna Coder, MD 07/24/2020 7:24 AM

## 2020-07-23 NOTE — Assessment & Plan Note (Addendum)
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  Asymptomatic platelets today- 80-90s; overall stable; continue surveillaince; CBC [with PCP;TSH ] q 3 M.  No indication for therapy at this time  # CHRONIC Mild Leucocytosis/neutrophilia- WBC 13- not increasing; stable  # DISPOSITION: # follow up in 6 months- MD; labs- cbc/bmp-Dr.B  Cc; Dr.L

## 2021-01-22 ENCOUNTER — Inpatient Hospital Stay: Payer: Self-pay | Admitting: Internal Medicine

## 2021-01-22 ENCOUNTER — Inpatient Hospital Stay: Payer: Self-pay

## 2021-02-03 ENCOUNTER — Other Ambulatory Visit: Payer: Self-pay | Admitting: *Deleted

## 2021-02-03 DIAGNOSIS — D696 Thrombocytopenia, unspecified: Secondary | ICD-10-CM

## 2021-02-04 ENCOUNTER — Encounter: Payer: Self-pay | Admitting: Internal Medicine

## 2021-02-04 ENCOUNTER — Inpatient Hospital Stay: Payer: BC Managed Care – PPO | Attending: Internal Medicine

## 2021-02-04 ENCOUNTER — Inpatient Hospital Stay (HOSPITAL_BASED_OUTPATIENT_CLINIC_OR_DEPARTMENT_OTHER): Payer: BC Managed Care – PPO | Admitting: Internal Medicine

## 2021-02-04 VITALS — BP 115/73 | HR 94 | Temp 98.3°F | Resp 16 | Ht 68.5 in | Wt 218.0 lb

## 2021-02-04 DIAGNOSIS — Z79899 Other long term (current) drug therapy: Secondary | ICD-10-CM | POA: Diagnosis not present

## 2021-02-04 DIAGNOSIS — E039 Hypothyroidism, unspecified: Secondary | ICD-10-CM | POA: Diagnosis not present

## 2021-02-04 DIAGNOSIS — Z8349 Family history of other endocrine, nutritional and metabolic diseases: Secondary | ICD-10-CM | POA: Diagnosis not present

## 2021-02-04 DIAGNOSIS — G4733 Obstructive sleep apnea (adult) (pediatric): Secondary | ICD-10-CM | POA: Insufficient documentation

## 2021-02-04 DIAGNOSIS — D696 Thrombocytopenia, unspecified: Secondary | ICD-10-CM

## 2021-02-04 LAB — CBC WITH DIFFERENTIAL/PLATELET
Abs Immature Granulocytes: 0.09 10*3/uL — ABNORMAL HIGH (ref 0.00–0.07)
Basophils Absolute: 0.1 10*3/uL (ref 0.0–0.1)
Basophils Relative: 1 %
Eosinophils Absolute: 0.4 10*3/uL (ref 0.0–0.5)
Eosinophils Relative: 3 %
HCT: 43.8 % (ref 39.0–52.0)
Hemoglobin: 15.2 g/dL (ref 13.0–17.0)
Immature Granulocytes: 1 %
Lymphocytes Relative: 20 %
Lymphs Abs: 2.6 10*3/uL (ref 0.7–4.0)
MCH: 29.9 pg (ref 26.0–34.0)
MCHC: 34.7 g/dL (ref 30.0–36.0)
MCV: 86.2 fL (ref 80.0–100.0)
Monocytes Absolute: 0.5 10*3/uL (ref 0.1–1.0)
Monocytes Relative: 4 %
Neutro Abs: 9.8 10*3/uL — ABNORMAL HIGH (ref 1.7–7.7)
Neutrophils Relative %: 71 %
Platelets: 95 10*3/uL — ABNORMAL LOW (ref 150–400)
RBC: 5.08 MIL/uL (ref 4.22–5.81)
RDW: 14.8 % (ref 11.5–15.5)
WBC: 13.4 10*3/uL — ABNORMAL HIGH (ref 4.0–10.5)
nRBC: 0.1 % (ref 0.0–0.2)

## 2021-02-04 LAB — BASIC METABOLIC PANEL
Anion gap: 9 (ref 5–15)
BUN: 18 mg/dL (ref 6–20)
CO2: 23 mmol/L (ref 22–32)
Calcium: 9 mg/dL (ref 8.9–10.3)
Chloride: 104 mmol/L (ref 98–111)
Creatinine, Ser: 0.98 mg/dL (ref 0.61–1.24)
GFR, Estimated: >60 mL/min (ref >60)
Glucose, Bld: 208 mg/dL — ABNORMAL HIGH (ref 70–99)
Potassium: 3.9 mmol/L (ref 3.5–5.1)
Sodium: 136 mmol/L (ref 135–145)

## 2021-02-04 NOTE — Assessment & Plan Note (Addendum)
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  Asymptomatic platelets today- 80-90s; stable; continue surveillaince; CBC [with PCP;TSH ] q 3 M.  No indication for therapy at this time  # CHRONIC Mild Leucocytosis/neutrophilia- WBC 13- not increasing-STABLE  #Screen for hemochromatosis: Father diagnosed with hemochromatosis.  Will order genotyping at next visit.  I had a long discussion with the patient regarding the pathology/natural history of hemochromatosis.  Discussed the potential complications of hemochromatosis including but not limited to fatigue/excess iron deposition in various organs of the body-including joints/heart/gonadal/pancreas/pituitary/skin etc.  However, it is extremely rare to have full-blown disease.  Currently patient is asymptomatic.  Would recommend screening at next visit.   # Post BF- BG- 208; discussed re: follow up with Dr.Linthaviong.   # DISPOSITION: # follow up in Jan last week - MD; labs- cbc/cmp; hemochromatosis gene test; iron studies/ferrittin- -Dr.B  Cc; Dr.L

## 2021-02-04 NOTE — Patient Instructions (Signed)
#  follow up with Dr.Linthavong re: elevated Blood glucose.

## 2021-02-04 NOTE — Progress Notes (Signed)
Wants to make you aware that his dad and aunt are being treated for hemochromatosis. Did not know if he should be concerned about having levels checked.

## 2021-02-08 NOTE — Progress Notes (Signed)
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   # MILD CHRONIC NEUTROPHILIA- [?2006]  # Congenital Bilateral Absent Radii  Oncology History   No history exists.     HISTORY OF PRESENTING ILLNESS:  Kent Gordon 45 y.o.  male very pleasant, with a history of congenital bilateral absent radius and long-standing history of thrombocytopenia/mild neutrophilia- Is here for follow-up.    Patient denies any nausea vomiting abdominal pain.  Denies any bleeding.  Patient states his father and aunt have been diagnosed with hemochromatosis.  He is interested in further work-up.   Review of Systems  Constitutional:  Negative for chills, diaphoresis, fever, malaise/fatigue and weight loss.  HENT:  Negative for nosebleeds and sore throat.   Eyes:  Negative for double vision.  Respiratory:  Negative for cough, hemoptysis, sputum production, shortness of breath and wheezing.   Cardiovascular:  Negative for chest pain, palpitations, orthopnea and leg swelling.  Gastrointestinal:  Negative for abdominal pain, blood in stool, constipation, diarrhea, heartburn, melena, nausea and vomiting.  Genitourinary:  Negative for dysuria, frequency and urgency.  Musculoskeletal:  Negative for back pain and joint pain.  Skin: Negative.  Negative for itching and rash.  Neurological:  Negative for dizziness, tingling, focal weakness, weakness and headaches.  Endo/Heme/Allergies:  Does not bruise/bleed easily.  Psychiatric/Behavioral:  Negative for depression. The patient is not nervous/anxious and does not have insomnia.     MEDICAL HISTORY:  Past Medical History:  Diagnosis Date    Hemorrhoids    Hypothyroidism    Obstructive sleep apnea on CPAP    Sleep apnea    Thrombocytopenia (HCC)     SURGICAL HISTORY: Past Surgical History:  Procedure Laterality Date   Radius surgeries x 6     Right ACL surgery  2012    SOCIAL HISTORY:  Social History   Socioeconomic History   Marital status: Married    Spouse name: Not on file   Number of children: Not on file   Years of education: Not on file   Highest education level: Not on file  Occupational History   Not on file  Tobacco Use   Smoking status: Never   Smokeless tobacco: Never  Substance and Sexual Activity   Alcohol use: No   Drug use: No   Sexual activity: Not on file  Other Topics Concern   Not on file  Social History Narrative   ocassional alochol; no smoking; assistant principal; Cheree Ditto, Plays golf.    Social Determinants of Health   Financial Resource Strain: Not on file  Food Insecurity: Not on file  Transportation Needs: Not on file  Physical Activity: Not on file  Stress: Not on file  Social Connections: Not on file  Intimate Partner Violence: Not on file    FAMILY HISTORY: grandma/mat- low platelets.  No family history on file.  ALLERGIES:  has No Known Allergies.  MEDICATIONS:  Current Outpatient Medications  Medication Sig Dispense Refill   levothyroxine (SYNTHROID) 88 MCG tablet Take 1 tablet by mouth daily.     No current facility-administered medications for this visit.      Marland Kitchen  PHYSICAL EXAMINATION: ECOG PERFORMANCE STATUS: 0 - Asymptomatic  Vitals:   02/04/21 0950  BP: 115/73  Pulse:  94  Resp: 16  Temp: 98.3 F (36.8 C)  SpO2: 100%   Filed Weights   02/04/21 0950  Weight: 218 lb (98.9 kg)    Physical Exam Constitutional:      Comments: Ambulating independently; alone.   HENT:     Head: Normocephalic and atraumatic.     Mouth/Throat:     Pharynx: No oropharyngeal exudate.  Eyes:     Pupils: Pupils are equal, round, and reactive to light.   Cardiovascular:     Rate and Rhythm: Normal rate and regular rhythm.  Pulmonary:     Effort: No respiratory distress.     Breath sounds: No wheezing.  Abdominal:     General: Bowel sounds are normal. There is no distension.     Palpations: Abdomen is soft. There is no mass.     Tenderness: There is no abdominal tenderness. There is no guarding or rebound.  Musculoskeletal:        General: Deformity present. No tenderness. Normal range of motion.     Cervical back: Normal range of motion and neck supple.     Comments: Congenital deformity bilateral upper extremities.  Skin:    General: Skin is warm.  Neurological:     Mental Status: He is alert and oriented to person, place, and time.  Psychiatric:        Mood and Affect: Affect normal.     LABORATORY DATA:  I have reviewed the data as listed Lab Results  Component Value Date   WBC 13.4 (H) 02/04/2021   HGB 15.2 02/04/2021   HCT 43.8 02/04/2021   MCV 86.2 02/04/2021   PLT 95 (L) 02/04/2021   Recent Labs    07/23/20 1003 02/04/21 0924  NA 134* 136  K 3.8 3.9  CL 104 104  CO2 20* 23  GLUCOSE 115* 208*  BUN 17 18  CREATININE 0.81 0.98  CALCIUM 8.6* 9.0  GFRNONAA >60 >60    RADIOGRAPHIC STUDIES: I have personally reviewed the radiological images as listed and agreed with the findings in the report. No results found.  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  Asymptomatic platelets today- 80-90s; stable; continue surveillaince; CBC [with PCP;TSH ] q 3 M.  No indication for therapy at this time  # CHRONIC Mild Leucocytosis/neutrophilia- WBC 13- not increasing-STABLE  #Screen for hemochromatosis: Father diagnosed with hemochromatosis.  Will order genotyping at next visit.  I had a long discussion with the patient regarding the pathology/natural history of hemochromatosis.  Discussed the potential complications of hemochromatosis including but not limited to fatigue/excess  iron deposition in various organs of the body-including joints/heart/gonadal/pancreas/pituitary/skin etc.  However, it is extremely rare to have full-blown disease.  Currently patient is asymptomatic.  Would recommend screening at next visit.   # Post BF- BG- 208; discussed re: follow up with Dr.Linthaviong.   # DISPOSITION: # follow up in Jan last week - MD; labs- cbc/cmp; hemochromatosis gene test; iron studies/ferrittin- -Dr.B  Cc; Dr.L     Earna Coder, MD 02/08/2021 4:41 PM

## 2021-08-19 ENCOUNTER — Telehealth: Payer: Self-pay | Admitting: Internal Medicine

## 2021-08-19 NOTE — Telephone Encounter (Signed)
Done pt aware °

## 2021-08-19 NOTE — Telephone Encounter (Signed)
Pt called to reschedule his appt for 1-26. Call back at [phone]

## 2021-08-30 ENCOUNTER — Other Ambulatory Visit: Payer: Self-pay | Admitting: *Deleted

## 2021-08-30 DIAGNOSIS — Z8349 Family history of other endocrine, nutritional and metabolic diseases: Secondary | ICD-10-CM

## 2021-08-30 DIAGNOSIS — D696 Thrombocytopenia, unspecified: Secondary | ICD-10-CM

## 2021-09-02 ENCOUNTER — Other Ambulatory Visit: Payer: BC Managed Care – PPO

## 2021-09-02 ENCOUNTER — Ambulatory Visit: Payer: BC Managed Care – PPO | Admitting: Internal Medicine

## 2021-09-06 ENCOUNTER — Inpatient Hospital Stay: Payer: BC Managed Care – PPO | Attending: Internal Medicine

## 2021-09-06 ENCOUNTER — Encounter: Payer: Self-pay | Admitting: Internal Medicine

## 2021-09-06 ENCOUNTER — Inpatient Hospital Stay (HOSPITAL_BASED_OUTPATIENT_CLINIC_OR_DEPARTMENT_OTHER): Payer: BC Managed Care – PPO | Admitting: Internal Medicine

## 2021-09-06 ENCOUNTER — Other Ambulatory Visit: Payer: Self-pay

## 2021-09-06 VITALS — BP 132/84 | HR 89 | Temp 98.4°F | Resp 16 | Wt 233.0 lb

## 2021-09-06 DIAGNOSIS — D696 Thrombocytopenia, unspecified: Secondary | ICD-10-CM | POA: Diagnosis present

## 2021-09-06 DIAGNOSIS — Z8349 Family history of other endocrine, nutritional and metabolic diseases: Secondary | ICD-10-CM

## 2021-09-06 DIAGNOSIS — D72829 Elevated white blood cell count, unspecified: Secondary | ICD-10-CM | POA: Insufficient documentation

## 2021-09-06 LAB — CBC WITH DIFFERENTIAL/PLATELET
Abs Immature Granulocytes: 0.12 10*3/uL — ABNORMAL HIGH (ref 0.00–0.07)
Basophils Absolute: 0.1 10*3/uL (ref 0.0–0.1)
Basophils Relative: 1 %
Eosinophils Absolute: 0.4 10*3/uL (ref 0.0–0.5)
Eosinophils Relative: 3 %
HCT: 43 % (ref 39.0–52.0)
Hemoglobin: 15 g/dL (ref 13.0–17.0)
Immature Granulocytes: 1 %
Lymphocytes Relative: 19 %
Lymphs Abs: 2.7 10*3/uL (ref 0.7–4.0)
MCH: 29.9 pg (ref 26.0–34.0)
MCHC: 34.9 g/dL (ref 30.0–36.0)
MCV: 85.7 fL (ref 80.0–100.0)
Monocytes Absolute: 0.5 10*3/uL (ref 0.1–1.0)
Monocytes Relative: 4 %
Neutro Abs: 10 10*3/uL — ABNORMAL HIGH (ref 1.7–7.7)
Neutrophils Relative %: 72 %
Platelets: 81 10*3/uL — ABNORMAL LOW (ref 150–400)
RBC: 5.02 MIL/uL (ref 4.22–5.81)
RDW: 14.8 % (ref 11.5–15.5)
WBC: 13.9 10*3/uL — ABNORMAL HIGH (ref 4.0–10.5)
nRBC: 0 % (ref 0.0–0.2)

## 2021-09-06 LAB — COMPREHENSIVE METABOLIC PANEL
ALT: 22 U/L (ref 0–44)
AST: 21 U/L (ref 15–41)
Albumin: 4.2 g/dL (ref 3.5–5.0)
Alkaline Phosphatase: 52 U/L (ref 38–126)
Anion gap: 8 (ref 5–15)
BUN: 11 mg/dL (ref 6–20)
CO2: 23 mmol/L (ref 22–32)
Calcium: 8.7 mg/dL — ABNORMAL LOW (ref 8.9–10.3)
Chloride: 104 mmol/L (ref 98–111)
Creatinine, Ser: 0.78 mg/dL (ref 0.61–1.24)
GFR, Estimated: >60 mL/min (ref >60)
Glucose, Bld: 117 mg/dL — ABNORMAL HIGH (ref 70–99)
Potassium: 4 mmol/L (ref 3.5–5.1)
Sodium: 135 mmol/L (ref 135–145)
Total Bilirubin: 1.4 mg/dL — ABNORMAL HIGH (ref 0.3–1.2)
Total Protein: 6.8 g/dL (ref 6.5–8.1)

## 2021-09-06 LAB — IRON AND TIBC
Iron: 100 ug/dL (ref 45–182)
Saturation Ratios: 55 % — ABNORMAL HIGH (ref 17.9–39.5)
TIBC: 182 ug/dL — ABNORMAL LOW (ref 250–450)
UIBC: 82 ug/dL

## 2021-09-06 LAB — FERRITIN: Ferritin: 56 ng/mL (ref 24–336)

## 2021-09-06 NOTE — Progress Notes (Signed)
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   # MILD CHRONIC NEUTROPHILIA- [?2006]  # Congenital Bilateral Absent Radii  Oncology History   No history exists.     HISTORY OF PRESENTING ILLNESS: Alone.  Ambulating independently. Kent Gordon 46 y.o.  male very pleasant, with a history of congenital bilateral absent radius and long-standing history of thrombocytopenia/mild neutrophilia- Is here for follow-up.    Patient continues to deny any significant bleeding or bruising.  He is currently awaiting hemochromatosis testing today.   Review of Systems  Constitutional:  Negative for chills, diaphoresis, fever, malaise/fatigue and weight loss.  HENT:  Negative for nosebleeds and sore throat.   Eyes:  Negative for double vision.  Respiratory:  Negative for cough, hemoptysis, sputum production, shortness of breath and wheezing.   Cardiovascular:  Negative for chest pain, palpitations, orthopnea and leg swelling.  Gastrointestinal:  Negative for abdominal pain, blood in stool, constipation, diarrhea, heartburn, melena, nausea and vomiting.  Genitourinary:  Negative for dysuria, frequency and urgency.  Musculoskeletal:  Negative for back pain and joint pain.  Skin: Negative.  Negative for itching and rash.  Neurological:  Negative for dizziness, tingling, focal weakness, weakness and headaches.  Endo/Heme/Allergies:  Does not bruise/bleed easily.  Psychiatric/Behavioral:  Negative for depression. The patient is not nervous/anxious and does not have insomnia.     MEDICAL HISTORY:  Past Medical History:  Diagnosis Date   Hemorrhoids    Hypothyroidism     Obstructive sleep apnea on CPAP    Sleep apnea    Thrombocytopenia (HCC)     SURGICAL HISTORY: Past Surgical History:  Procedure Laterality Date   Radius surgeries x 6     Right ACL surgery  2012    SOCIAL HISTORY:  Social History   Socioeconomic History   Marital status: Married    Spouse name: Not on file   Number of children: Not on file   Years of education: Not on file   Highest education level: Not on file  Occupational History   Not on file  Tobacco Use   Smoking status: Never   Smokeless tobacco: Never  Substance and Sexual Activity   Alcohol use: No   Drug use: No   Sexual activity: Not on file  Other Topics Concern   Not on file  Social History Narrative   ocassional alochol; no smoking; assistant principal; [employer]ree Ditto, Plays golf.    Social Determinants of Health   Financial Resource Strain: Not on file  Food Insecurity: Not on file  Transportation Needs: Not on file  Physical Activity: Not on file  Stress: Not on file  Social Connections: Not on file  Intimate Partner Violence: Not on file    FAMILY HISTORY: grandma/mat- low platelets.  History reviewed. No pertinent family history.  ALLERGIES:  has No Known Allergies.  MEDICATIONS:  Current Outpatient Medications  Medication Sig Dispense Refill   levothyroxine (SYNTHROID) 88 MCG tablet Take 1 tablet by mouth daily.     No current facility-administered medications for this visit.      Marland Kitchen  PHYSICAL EXAMINATION: ECOG PERFORMANCE STATUS: 0 - Asymptomatic  Vitals:   09/06/21 1009  BP: 132/84  Pulse: 89  Resp: 16  Temp: 98.4 F (36.9  C)   Filed Weights   09/06/21 1009  Weight: 233 lb (105.7 kg)    Physical Exam Constitutional:      Comments: Ambulating independently; alone.   HENT:     Head: Normocephalic and atraumatic.     Mouth/Throat:     Pharynx: No oropharyngeal exudate.  Eyes:     Pupils: Pupils are equal, round, and reactive to light.  Cardiovascular:     Rate and  Rhythm: Normal rate and regular rhythm.  Pulmonary:     Effort: No respiratory distress.     Breath sounds: No wheezing.  Abdominal:     General: Bowel sounds are normal. There is no distension.     Palpations: Abdomen is soft. There is no mass.     Tenderness: There is no abdominal tenderness. There is no guarding or rebound.  Musculoskeletal:        General: Deformity present. No tenderness. Normal range of motion.     Cervical back: Normal range of motion and neck supple.     Comments: Congenital deformity bilateral upper extremities.  Skin:    General: Skin is warm.  Neurological:     Mental Status: He is alert and oriented to person, place, and time.  Psychiatric:        Mood and Affect: Affect normal.     LABORATORY DATA:  I have reviewed the data as listed Lab Results  Component Value Date   WBC 13.9 (H) 09/06/2021   HGB 15.0 09/06/2021   HCT 43.0 09/06/2021   MCV 85.7 09/06/2021   PLT 81 (L) 09/06/2021   Recent Labs    02/04/21 0924 09/06/21 0955  NA 136 135  K 3.9 4.0  CL 104 104  CO2 23 23  GLUCOSE 208* 117*  BUN 18 11  CREATININE 0.98 0.78  CALCIUM 9.0 8.7*  GFRNONAA >60 >60  PROT  --  6.8  ALBUMIN  --  4.2  AST  --  21  ALT  --  22  ALKPHOS  --  52  BILITOT  --  1.4*    RADIOGRAPHIC STUDIES: I have personally reviewed the radiological images as listed and agreed with the findings in the report. No results found.  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  Asymptomatic platelets today- 80-90s; stable; continue surveillaince; CBC [with PCP;TSH ] q 3 M.  No indication for therapy at this time  # CHRONIC Mild Leucocytosis/neutrophilia- WBC 13- not increasing-STABLE  #Screen for hemochromatosis: Father/ broether diagnosed with hemochromatosis; awaiting genotyping iron levels today.  If elevated would recommend phlebotomy; clinically less likely.  # Post BF- BG- 117 [cut soda]; continue increasing  exercise/weight loss.  # DISPOSITION: # follow up in  6 months-  - MD; labs- cbc/cmp;  iron studies/ferrittin- -Dr.B  Cc; Dr.L     Earna Coder, MD 09/06/2021 10:42 AM

## 2021-09-06 NOTE — Progress Notes (Signed)
Patient denies new problems/concerns today.   °

## 2021-09-06 NOTE — Assessment & Plan Note (Signed)
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  Asymptomatic platelets today- 80-90s; stable; continue surveillaince; CBC [with PCP;TSH ] q 3 M.  No indication for therapy at this time  # CHRONIC Mild Leucocytosis/neutrophilia- WBC 13- not increasing-STABLE  #Screen for hemochromatosis: Father/ broether diagnosed with hemochromatosis; awaiting genotyping iron levels today.  If elevated would recommend phlebotomy; clinically less likely.  # Post BF- BG- 117 [cut soda]; continue increasing exercise/weight loss.  # DISPOSITION: # follow up in  6 months-  - MD; labs- cbc/cmp;  iron studies/ferrittin- -Dr.B  Cc; Dr.L

## 2021-09-14 LAB — HEMOCHROMATOSIS DNA-PCR(C282Y,H63D)

## 2022-03-07 ENCOUNTER — Encounter: Payer: Self-pay | Admitting: Nurse Practitioner

## 2022-03-07 ENCOUNTER — Ambulatory Visit: Payer: BC Managed Care – PPO | Admitting: Internal Medicine

## 2022-03-07 ENCOUNTER — Inpatient Hospital Stay (HOSPITAL_BASED_OUTPATIENT_CLINIC_OR_DEPARTMENT_OTHER): Payer: BC Managed Care – PPO | Admitting: Nurse Practitioner

## 2022-03-07 ENCOUNTER — Inpatient Hospital Stay: Payer: BC Managed Care – PPO | Attending: Nurse Practitioner

## 2022-03-07 VITALS — BP 131/81 | HR 87 | Temp 98.7°F | Resp 20 | Wt 237.6 lb

## 2022-03-07 DIAGNOSIS — D72829 Elevated white blood cell count, unspecified: Secondary | ICD-10-CM | POA: Diagnosis not present

## 2022-03-07 DIAGNOSIS — Q7140 Longitudinal reduction defect of unspecified radius: Secondary | ICD-10-CM | POA: Insufficient documentation

## 2022-03-07 DIAGNOSIS — D696 Thrombocytopenia, unspecified: Secondary | ICD-10-CM

## 2022-03-07 DIAGNOSIS — Z148 Genetic carrier of other disease: Secondary | ICD-10-CM

## 2022-03-07 DIAGNOSIS — D729 Disorder of white blood cells, unspecified: Secondary | ICD-10-CM | POA: Diagnosis not present

## 2022-03-07 DIAGNOSIS — Z8349 Family history of other endocrine, nutritional and metabolic diseases: Secondary | ICD-10-CM

## 2022-03-07 LAB — CBC WITH DIFFERENTIAL/PLATELET
Abs Immature Granulocytes: 0.09 10*3/uL — ABNORMAL HIGH (ref 0.00–0.07)
Basophils Absolute: 0.1 10*3/uL (ref 0.0–0.1)
Basophils Relative: 1 %
Eosinophils Absolute: 0.3 10*3/uL (ref 0.0–0.5)
Eosinophils Relative: 3 %
HCT: 39.2 % (ref 39.0–52.0)
Hemoglobin: 13.4 g/dL (ref 13.0–17.0)
Immature Granulocytes: 1 %
Lymphocytes Relative: 21 %
Lymphs Abs: 2.4 10*3/uL (ref 0.7–4.0)
MCH: 29.8 pg (ref 26.0–34.0)
MCHC: 34.2 g/dL (ref 30.0–36.0)
MCV: 87.3 fL (ref 80.0–100.0)
Monocytes Absolute: 0.5 10*3/uL (ref 0.1–1.0)
Monocytes Relative: 5 %
Neutro Abs: 7.9 10*3/uL — ABNORMAL HIGH (ref 1.7–7.7)
Neutrophils Relative %: 69 %
Platelets: 69 10*3/uL — ABNORMAL LOW (ref 150–400)
RBC: 4.49 MIL/uL (ref 4.22–5.81)
RDW: 15.5 % (ref 11.5–15.5)
WBC: 11.2 10*3/uL — ABNORMAL HIGH (ref 4.0–10.5)
nRBC: 0 % (ref 0.0–0.2)

## 2022-03-07 LAB — COMPREHENSIVE METABOLIC PANEL
ALT: 19 U/L (ref 0–44)
AST: 20 U/L (ref 15–41)
Albumin: 4.3 g/dL (ref 3.5–5.0)
Alkaline Phosphatase: 52 U/L (ref 38–126)
Anion gap: 8 (ref 5–15)
BUN: 14 mg/dL (ref 6–20)
CO2: 22 mmol/L (ref 22–32)
Calcium: 8.6 mg/dL — ABNORMAL LOW (ref 8.9–10.3)
Chloride: 105 mmol/L (ref 98–111)
Creatinine, Ser: 0.95 mg/dL (ref 0.61–1.24)
GFR, Estimated: >60 mL/min (ref >60)
Glucose, Bld: 131 mg/dL — ABNORMAL HIGH (ref 70–99)
Potassium: 3.9 mmol/L (ref 3.5–5.1)
Sodium: 135 mmol/L (ref 135–145)
Total Bilirubin: 1.3 mg/dL — ABNORMAL HIGH (ref 0.3–1.2)
Total Protein: 6.8 g/dL (ref 6.5–8.1)

## 2022-03-07 LAB — IRON AND TIBC
Iron: 62 ug/dL (ref 45–182)
Saturation Ratios: 17 % — ABNORMAL LOW (ref 17.9–39.5)
TIBC: 371 ug/dL (ref 250–450)
UIBC: 309 ug/dL

## 2022-03-07 LAB — FERRITIN: Ferritin: 19 ng/mL — ABNORMAL LOW (ref 24–336)

## 2022-03-07 NOTE — Addendum Note (Signed)
Addended by: Ashley Royalty A on: 03/07/2022 10:35 AM   Modules accepted: Orders

## 2022-03-07 NOTE — Progress Notes (Signed)
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   # MILD CHRONIC NEUTROPHILIA- [?4098]  # Congenital Bilateral Absent Radii  Oncology History   No history exists.     HISTORY OF PRESENTING ILLNESS: Alone.  Ambulating independently. Kent Gordon 46 y.o.  male very pleasant, with a history of congenital bilateral absent radius and long-standing history of thrombocytopenia/mild neutrophilia who returns to clinic for follow-up.   Continues to deny significant bleeding or bruising. 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 Continues to feel well and denies complaints.    Review of Systems  Constitutional:  Negative for chills, diaphoresis, fever, malaise/fatigue and weight loss.  HENT:  Negative for nosebleeds and sore throat.   Eyes:  Negative for double vision.  Respiratory:  Negative for cough, hemoptysis, sputum production, shortness of breath and wheezing.   Cardiovascular:  Negative for chest pain, palpitations, orthopnea and leg swelling.  Gastrointestinal:  Negative for abdominal pain, blood in stool, constipation, diarrhea, heartburn, melena, nausea and vomiting.  Genitourinary:  Negative for dysuria, frequency and urgency.  Musculoskeletal:  Negative for back pain and joint pain.  Skin: Negative.  Negative for itching and rash.  Neurological:  Negative for dizziness, tingling, focal weakness, weakness and headaches.  Endo/Heme/Allergies:  Does not bruise/bleed easily.  Psychiatric/Behavioral:  Negative for depression. The patient is not nervous/anxious and does not have insomnia.     MEDICAL  HISTORY:  Past Medical History:  Diagnosis Date   Hemorrhoids    Hypothyroidism    Obstructive sleep apnea on CPAP    Sleep apnea    Thrombocytopenia ([place])     SURGICAL HISTORY: Past Surgical History:  Procedure Laterality Date   Radius surgeries x 6     Right ACL surgery  2012    SOCIAL HISTORY:  Social History   Socioeconomic History   Marital status: Married    Spouse name: Not on file   Number of children: Not on file   Years of education: Not on file   Highest education level: Not on file  Occupational History   Not on file  Tobacco Use   Smoking status: Never   Smokeless tobacco: Never  Substance and Sexual Activity   Alcohol use: No   Drug use: No   Sexual activity: Not on file  Other Topics Concern   Not on file  Social History Narrative   ocassional alochol; no smoking; assistant principal; [name], Plays golf.    Social Determinants of Health   Financial Resource Strain: Not on file  Food Insecurity: Not on file  Transportation Needs: Not on file  Physical Activity: Not on file  Stress: Not on file  Social Connections: Not on file  Intimate Partner Violence: Not on file    FAMILY HISTORY: grandma/mat- low platelets.  No family history on file.  ALLERGIES:  has No Known Allergies.  MEDICATIONS:  Current Outpatient Medications  Medication Sig Dispense Refill   levothyroxine (SYNTHROID) 88 MCG tablet Take 1 tablet by mouth daily.     No current facility-administered medications for this visit.    PHYSICAL EXAMINATION: ECOG PERFORMANCE STATUS: 0 - Asymptomatic There were no vitals filed for this visit.  There were no vitals  filed for this visit.   Physical Exam Constitutional:      Comments: Ambulating independently; alone.   HENT:     Head: Normocephalic and atraumatic.     Mouth/Throat:     Pharynx: No oropharyngeal exudate.  Eyes:     Pupils: Pupils are equal, round, and reactive to light.  Cardiovascular:     Rate and Rhythm:  Normal rate and regular rhythm.  Pulmonary:     Effort: No respiratory distress.     Breath sounds: No wheezing.  Abdominal:     General: Bowel sounds are normal. There is no distension.     Palpations: Abdomen is soft. There is no mass.     Tenderness: There is no abdominal tenderness. There is no guarding or rebound.  Musculoskeletal:        General: Deformity present. No tenderness. Normal range of motion.     Cervical back: Normal range of motion and neck supple.     Comments: Congenital deformity bilateral upper extremities.  Skin:    General: Skin is warm.  Neurological:     Mental Status: He is alert and oriented to person, place, and time.  Psychiatric:        Mood and Affect: Affect normal.     LABORATORY DATA:  I have reviewed the data as listed Lab Results  Component Value Date   WBC 13.9 (H) 09/06/2021   HGB 15.0 09/06/2021   HCT 43.0 09/06/2021   MCV 85.7 09/06/2021   PLT 81 (L) 09/06/2021   Recent Labs    09/06/21 0955  NA 135  K 4.0  CL 104  CO2 23  GLUCOSE 117*  BUN 11  CREATININE 0.78  CALCIUM 8.7*  GFRNONAA >60  PROT 6.8  ALBUMIN 4.2  AST 21  ALT 22  ALKPHOS 52  BILITOT 1.4*     RADIOGRAPHIC STUDIES: I have personally reviewed the radiological images as listed and agreed with the findings in the report. No results found.  ASSESSMENT & PLAN:   No problem-specific Assessment & Plan notes found for this encounter.  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  Asymptomatic platelets today have dropped to 69. Decreased but continue to hold treatment. Given drop, consider additional work up if platelets < 50,000, possible treatment. Clinically, asymptomatic. Check cbc, cmp, ldh, folate, peripheral smear if drops. Previously hepatitis work up was negative, b12 was normal. Ldh was normal, flow was normal. Check ultrasound of abdomen. If no clear etiology found, consider bone marrow biopsy vs trial steroids. May warrant  treatment in the future given drop however, not at this time.    # CHRONIC Mild Leucocytosis/neutrophilia- WBC 11.2. No rising. Stable. Monitor.    #Screen for hemochromatosis: Father/ brother diagnosed with hemochromatosis. Tested heterozygous for c.845G>A (p.Cys282Tyr). Negative for other mutations. Ferritin today is pending. If elevated would recommend phlebotomy. Avoid iron containing supplements.    # Post BF- BG- 117 [cut soda]; continue increasing exercise/weight loss.   # DISPOSITION: 3 mo- lab only (cbc) 6 mo- lab (cbc, cmp, ldh), Dr. Jacinto Reap- la   Verlon Au, NP 03/07/2022

## 2022-04-12 DIAGNOSIS — K921 Melena: Secondary | ICD-10-CM | POA: Insufficient documentation

## 2022-04-12 DIAGNOSIS — K642 Third degree hemorrhoids: Secondary | ICD-10-CM | POA: Insufficient documentation

## 2022-06-07 ENCOUNTER — Other Ambulatory Visit: Payer: BC Managed Care – PPO

## 2022-06-09 ENCOUNTER — Inpatient Hospital Stay: Payer: BC Managed Care – PPO | Attending: Oncology

## 2022-06-09 DIAGNOSIS — D696 Thrombocytopenia, unspecified: Secondary | ICD-10-CM | POA: Diagnosis present

## 2022-06-09 LAB — CBC WITH DIFFERENTIAL/PLATELET
Abs Immature Granulocytes: 0.13 10*3/uL — ABNORMAL HIGH (ref 0.00–0.07)
Basophils Absolute: 0.1 10*3/uL (ref 0.0–0.1)
Basophils Relative: 1 %
Eosinophils Absolute: 0.4 10*3/uL (ref 0.0–0.5)
Eosinophils Relative: 2 %
HCT: 44.3 % (ref 39.0–52.0)
Hemoglobin: 15.3 g/dL (ref 13.0–17.0)
Immature Granulocytes: 1 %
Lymphocytes Relative: 17 %
Lymphs Abs: 2.8 10*3/uL (ref 0.7–4.0)
MCH: 28.7 pg (ref 26.0–34.0)
MCHC: 34.5 g/dL (ref 30.0–36.0)
MCV: 83.1 fL (ref 80.0–100.0)
Monocytes Absolute: 0.6 10*3/uL (ref 0.1–1.0)
Monocytes Relative: 4 %
Neutro Abs: 12.5 10*3/uL — ABNORMAL HIGH (ref 1.7–7.7)
Neutrophils Relative %: 75 %
Platelets: 70 10*3/uL — ABNORMAL LOW (ref 150–400)
RBC: 5.33 MIL/uL (ref 4.22–5.81)
RDW: 15.9 % — ABNORMAL HIGH (ref 11.5–15.5)
WBC: 16.5 10*3/uL — ABNORMAL HIGH (ref 4.0–10.5)
nRBC: 0 % (ref 0.0–0.2)

## 2022-09-07 ENCOUNTER — Inpatient Hospital Stay: Payer: BC Managed Care – PPO

## 2022-09-07 ENCOUNTER — Inpatient Hospital Stay: Payer: BC Managed Care – PPO | Admitting: Internal Medicine

## 2022-09-07 ENCOUNTER — Inpatient Hospital Stay: Payer: BC Managed Care – PPO | Attending: Oncology | Admitting: Internal Medicine

## 2022-09-07 ENCOUNTER — Other Ambulatory Visit: Payer: BC Managed Care – PPO

## 2022-09-07 ENCOUNTER — Ambulatory Visit: Payer: BC Managed Care – PPO | Admitting: Oncology

## 2022-09-07 ENCOUNTER — Encounter: Payer: Self-pay | Admitting: Internal Medicine

## 2022-09-07 VITALS — BP 112/89 | HR 79 | Temp 97.9°F | Resp 18 | Wt 232.2 lb

## 2022-09-07 DIAGNOSIS — D696 Thrombocytopenia, unspecified: Secondary | ICD-10-CM | POA: Diagnosis not present

## 2022-09-07 LAB — COMPREHENSIVE METABOLIC PANEL
ALT: 22 U/L (ref 0–44)
AST: 20 U/L (ref 15–41)
Albumin: 4.5 g/dL (ref 3.5–5.0)
Alkaline Phosphatase: 57 U/L (ref 38–126)
Anion gap: 9 (ref 5–15)
BUN: 18 mg/dL (ref 6–20)
CO2: 22 mmol/L (ref 22–32)
Calcium: 8.8 mg/dL — ABNORMAL LOW (ref 8.9–10.3)
Chloride: 105 mmol/L (ref 98–111)
Creatinine, Ser: 0.92 mg/dL (ref 0.61–1.24)
GFR, Estimated: >60 mL/min (ref >60)
Glucose, Bld: 148 mg/dL — ABNORMAL HIGH (ref 70–99)
Potassium: 3.8 mmol/L (ref 3.5–5.1)
Sodium: 136 mmol/L (ref 135–145)
Total Bilirubin: 1.5 mg/dL — ABNORMAL HIGH (ref 0.3–1.2)
Total Protein: 6.9 g/dL (ref 6.5–8.1)

## 2022-09-07 LAB — CBC WITH DIFFERENTIAL/PLATELET
Abs Immature Granulocytes: 0.08 10*3/uL — ABNORMAL HIGH (ref 0.00–0.07)
Basophils Absolute: 0.1 10*3/uL (ref 0.0–0.1)
Basophils Relative: 1 %
Eosinophils Absolute: 0.3 10*3/uL (ref 0.0–0.5)
Eosinophils Relative: 3 %
HCT: 44.1 % (ref 39.0–52.0)
Hemoglobin: 15.2 g/dL (ref 13.0–17.0)
Immature Granulocytes: 1 %
Lymphocytes Relative: 19 %
Lymphs Abs: 2.2 10*3/uL (ref 0.7–4.0)
MCH: 29.4 pg (ref 26.0–34.0)
MCHC: 34.5 g/dL (ref 30.0–36.0)
MCV: 85.3 fL (ref 80.0–100.0)
Monocytes Absolute: 0.4 10*3/uL (ref 0.1–1.0)
Monocytes Relative: 4 %
Neutro Abs: 8.3 10*3/uL — ABNORMAL HIGH (ref 1.7–7.7)
Neutrophils Relative %: 72 %
Platelets: 89 10*3/uL — ABNORMAL LOW (ref 150–400)
RBC: 5.17 MIL/uL (ref 4.22–5.81)
RDW: 15 % (ref 11.5–15.5)
WBC: 11.4 10*3/uL — ABNORMAL HIGH (ref 4.0–10.5)
nRBC: 0 % (ref 0.0–0.2)

## 2022-09-07 LAB — LACTATE DEHYDROGENASE: LDH: 89 U/L — ABNORMAL LOW (ref 98–192)

## 2022-09-07 NOTE — Progress Notes (Signed)
Patient denies new problems/concerns today.   °

## 2022-09-07 NOTE — Progress Notes (Signed)
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   # MILD CHRONIC NEUTROPHILIA- [?2353]  # Congenital Bilateral Absent Radii  Oncology History   No history exists.     HISTORY OF PRESENTING ILLNESS: Alone.  Ambulating independently.  Kent Gordon 47 y.o.  male very pleasant, with a history of congenital bilateral absent radius and long-standing history of thrombocytopenia/mild neutrophilia; hemochromatosis heterozygous- Is here for follow-up.    Patient denies new problems/concerns today.   Patient continues to deny any significant bleeding or bruising.   Review of Systems  Constitutional:  Negative for chills, diaphoresis, fever, malaise/fatigue and weight loss.  HENT:  Negative for nosebleeds and sore throat.   Eyes:  Negative for double vision.  Respiratory:  Negative for cough, hemoptysis, sputum production, shortness of breath and wheezing.   Cardiovascular:  Negative for chest pain, palpitations, orthopnea and leg swelling.  Gastrointestinal:  Negative for abdominal pain, blood in stool, constipation, diarrhea, heartburn, melena, nausea and vomiting.  Genitourinary:  Negative for dysuria, frequency and urgency.  Musculoskeletal:  Negative for back pain and joint pain.  Skin: Negative.  Negative for itching and rash.  Neurological:  Negative for dizziness, tingling, focal weakness, weakness and headaches.  Endo/Heme/Allergies:  Does not bruise/bleed easily.  Psychiatric/Behavioral:  Negative for depression. The patient is not nervous/anxious and does not have insomnia.      MEDICAL HISTORY:  Past Medical History:  Diagnosis Date   Hemorrhoids     Hypothyroidism    Obstructive sleep apnea on CPAP    Sleep apnea    Thrombocytopenia ([place])     SURGICAL HISTORY: Past Surgical History:  Procedure Laterality Date   Radius surgeries x 6     Right ACL surgery  2012    SOCIAL HISTORY:  Social History   Socioeconomic History   Marital status: Married    Spouse name: Not on file   Number of children: Not on file   Years of education: Not on file   Highest education level: Not on file  Occupational History   Not on file  Tobacco Use   Smoking status: Never   Smokeless tobacco: Never  Substance and Sexual Activity   Alcohol use: No   Drug use: No   Sexual activity: Not on file  Other Topics Concern   Not on file  Social History Narrative   ocassional alochol; no smoking; assistant principal; [name], Plays golf.    Social Determinants of Health   Financial Resource Strain: Not on file  Food Insecurity: Not on file  Transportation Needs: Not on file  Physical Activity: Not on file  Stress: Not on file  Social Connections: Not on file  Intimate Partner Violence: Not on file    FAMILY HISTORY: grandma/mat- low platelets.  History reviewed. No pertinent family history.  ALLERGIES:  has No Known Allergies.  MEDICATIONS:  Current Outpatient Medications  Medication Sig Dispense Refill   ibuprofen (ADVIL) 200 MG tablet Take by mouth.     levothyroxine (SYNTHROID) 88 MCG tablet Take 1 tablet by mouth daily.     No current facility-administered medications for this visit.      Marland Kitchen  PHYSICAL EXAMINATION: ECOG PERFORMANCE STATUS: 0 - Asymptomatic  Vitals:   09/07/22  0900  BP: 112/89  Pulse: 79  Resp: 18  Temp: 97.9 F (36.6 C)   Filed Weights   09/07/22 0900  Weight: 232 lb 3.2 oz (105.3 kg)    Physical Exam Constitutional:      Comments: Ambulating independently; alone.   HENT:     Head: Normocephalic and atraumatic.     Mouth/Throat:     Pharynx: No oropharyngeal exudate.  Eyes:     Pupils: Pupils  are equal, round, and reactive to light.  Cardiovascular:     Rate and Rhythm: Normal rate and regular rhythm.  Pulmonary:     Effort: No respiratory distress.     Breath sounds: No wheezing.  Abdominal:     General: Bowel sounds are normal. There is no distension.     Palpations: Abdomen is soft. There is no mass.     Tenderness: There is no abdominal tenderness. There is no guarding or rebound.  Musculoskeletal:        General: Deformity present. No tenderness. Normal range of motion.     Cervical back: Normal range of motion and neck supple.     Comments: Congenital deformity bilateral upper extremities.  Skin:    General: Skin is warm.  Neurological:     Mental Status: He is alert and oriented to person, place, and time.  Psychiatric:        Mood and Affect: Affect normal.      LABORATORY DATA:  I have reviewed the data as listed Lab Results  Component Value Date   WBC 11.4 (H) 09/07/2022   HGB 15.2 09/07/2022   HCT 44.1 09/07/2022   MCV 85.3 09/07/2022   PLT 89 (L) 09/07/2022   Recent Labs    03/07/22 0943 09/07/22 0843  NA 135 136  K 3.9 3.8  CL 105 105  CO2 22 22  GLUCOSE 131* 148*  BUN 14 18  CREATININE 0.95 0.92  CALCIUM 8.6* 8.8*  GFRNONAA >60 >60  PROT 6.8 6.9  ALBUMIN 4.3 4.5  AST 20 20  ALT 19 22  ALKPHOS 52 57  BILITOT 1.3* 1.5*    RADIOGRAPHIC STUDIES: I have personally reviewed the radiological images as listed and agreed with the findings in the report. No results found.  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  Asymptomatic platelets today- 80-90s; stable; continue surveillaince; CBC [with PCP;TSH ] q 3 M.  No indication for therapy at this time. stable.  # CHRONIC Mild Leucocytosis/neutrophilia- WBC 13- not increasing-stable.   # hereditary hemochromatosis [JULY 2023]: Father/ broether diagnosed with hemochromatosis; c.845G>A (p.Cys282Tyr) - Detected, heterozygous- no phlebotomy.  Reviewed  the pathophysiology of hemochromatosis.  July 2023 saturation 17%; monitor for now.  # FASTING BG- 148- [cut soda]; continue increasing exercise/weight loss.  Recommend follow-up with PCP guarding diabetes management.  # DISPOSITION: # follow up in 12  months-  - MD; labs- cbc/cmp;  iron studies/ferrittin- -Dr.B  Cc; Dr.L     Cammie Sickle, MD 09/07/2022 10:49 AM

## 2022-09-07 NOTE — Assessment & Plan Note (Addendum)
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  Asymptomatic platelets today- 80-90s; stable; continue surveillaince; CBC [with PCP;TSH ] q 3 M.  No indication for therapy at this time. stable.  # CHRONIC Mild Leucocytosis/neutrophilia- WBC 13- not increasing-stable.   # hereditary hemochromatosis [JULY 2023]: Father/ broether diagnosed with hemochromatosis; c.845G>A (p.Cys282Tyr) - Detected, heterozygous- no phlebotomy.  Reviewed the pathophysiology of hemochromatosis.  July 2023 saturation 17%; monitor for now.  # FASTING BG- 148- [cut soda]; continue increasing exercise/weight loss.  Recommend follow-up with PCP guarding diabetes management.  # DISPOSITION: # follow up in 12  months-  - MD; labs- cbc/cmp;  iron studies/ferrittin- -Dr.B  Cc; Dr.L

## 2023-09-08 ENCOUNTER — Inpatient Hospital Stay: Payer: 59 | Admitting: Internal Medicine

## 2023-09-08 ENCOUNTER — Encounter: Payer: Self-pay | Admitting: Internal Medicine

## 2023-09-08 ENCOUNTER — Inpatient Hospital Stay: Payer: 59 | Attending: Internal Medicine

## 2023-09-08 VITALS — BP 106/64 | HR 92 | Temp 97.7°F | Ht 68.5 in | Wt 210.4 lb

## 2023-09-08 DIAGNOSIS — E611 Iron deficiency: Secondary | ICD-10-CM | POA: Insufficient documentation

## 2023-09-08 DIAGNOSIS — D72829 Elevated white blood cell count, unspecified: Secondary | ICD-10-CM | POA: Insufficient documentation

## 2023-09-08 DIAGNOSIS — D696 Thrombocytopenia, unspecified: Secondary | ICD-10-CM | POA: Diagnosis present

## 2023-09-08 LAB — COMPREHENSIVE METABOLIC PANEL
ALT: 14 U/L (ref 0–44)
AST: 13 U/L — ABNORMAL LOW (ref 15–41)
Albumin: 4.7 g/dL (ref 3.5–5.0)
Alkaline Phosphatase: 52 U/L (ref 38–126)
Anion gap: 8 (ref 5–15)
BUN: 21 mg/dL — ABNORMAL HIGH (ref 6–20)
CO2: 22 mmol/L (ref 22–32)
Calcium: 9 mg/dL (ref 8.9–10.3)
Chloride: 104 mmol/L (ref 98–111)
Creatinine, Ser: 0.82 mg/dL (ref 0.61–1.24)
GFR, Estimated: >60 mL/min (ref >60)
Glucose, Bld: 117 mg/dL — ABNORMAL HIGH (ref 70–99)
Potassium: 3.8 mmol/L (ref 3.5–5.1)
Sodium: 134 mmol/L — ABNORMAL LOW (ref 135–145)
Total Bilirubin: 1.4 mg/dL — ABNORMAL HIGH (ref 0.0–1.2)
Total Protein: 7.1 g/dL (ref 6.5–8.1)

## 2023-09-08 LAB — IRON AND TIBC
Iron: 83 ug/dL (ref 45–182)
Saturation Ratios: 25 % (ref 17.9–39.5)
TIBC: 333 ug/dL (ref 250–450)
UIBC: 250 ug/dL

## 2023-09-08 LAB — CBC WITH DIFFERENTIAL/PLATELET
Abs Immature Granulocytes: 0.07 10*3/uL (ref 0.00–0.07)
Basophils Absolute: 0.1 10*3/uL (ref 0.0–0.1)
Basophils Relative: 1 %
Eosinophils Absolute: 0.4 10*3/uL (ref 0.0–0.5)
Eosinophils Relative: 3 %
HCT: 45.4 % (ref 39.0–52.0)
Hemoglobin: 15.5 g/dL (ref 13.0–17.0)
Immature Granulocytes: 1 %
Lymphocytes Relative: 20 %
Lymphs Abs: 2.3 10*3/uL (ref 0.7–4.0)
MCH: 29.7 pg (ref 26.0–34.0)
MCHC: 34.1 g/dL (ref 30.0–36.0)
MCV: 87 fL (ref 80.0–100.0)
Monocytes Absolute: 0.6 10*3/uL (ref 0.1–1.0)
Monocytes Relative: 5 %
Neutro Abs: 8.2 10*3/uL — ABNORMAL HIGH (ref 1.7–7.7)
Neutrophils Relative %: 70 %
Platelets: 107 10*3/uL — ABNORMAL LOW (ref 150–400)
RBC: 5.22 MIL/uL (ref 4.22–5.81)
RDW: 14.8 % (ref 11.5–15.5)
WBC: 11.5 10*3/uL — ABNORMAL HIGH (ref 4.0–10.5)
nRBC: 0 % (ref 0.0–0.2)

## 2023-09-08 LAB — FERRITIN: Ferritin: 68 ng/mL (ref 24–336)

## 2023-09-08 NOTE — Assessment & Plan Note (Signed)
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  Asymptomatic platelets today- 80-90s; stable; continue surveillaince; CBC [with PCP;TSH ] q 3 M.  No indication for therapy at this time. stable.  # CHRONIC Mild Leucocytosis/neutrophilia- WBC 11- not increasing-stable.   # hereditary hemochromatosis [JULY 2023]: Father/ broether diagnosed with hemochromatosis; c.845G>A (p.Cys282Tyr) - Detected, heterozygous- no phlebotomy.  Reviewed the pathophysiology of hemochromatosis.  July 2023 saturation 17%; monitor for now.  # Iron def without anemia: Colonoscopy - [11/13/20]  - Three tubular adenomas, Gr II internal hemorrhoids s/p banding; EGD: 10/28/2004- stable; monitor for now. HOLD off PO iron.   # FASTING BG- 117 [on CGM]; continue increasing exercise/weight loss.  Recommend follow-up with PCP guarding diabetes management.  # DISPOSITION: # follow up in 12  months-  - MD; labs- cbc/cmp;  iron studies/ferrittin- -Dr.B  Cc; Dr.L

## 2023-09-08 NOTE — Progress Notes (Signed)
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   # MILD CHRONIC NEUTROPHILIA- [?2006]  # Congenital Bilateral Absent Radii  Oncology History   No history exists.    HISTORY OF PRESENTING ILLNESS: Alone.  Ambulating independently.  Kent Gordon 48 y.o.  male very pleasant, with a history of congenital bilateral absent radius and long-standing history of thrombocytopenia/mild neutrophilia; hemochromatosis heterozygous- Is here for follow-up.    Patient denies new problems/concerns today. Patient continues to deny any significant bleeding or bruising.   Review of Systems  Constitutional:  Negative for chills, diaphoresis, fever, malaise/fatigue and weight loss.  HENT:  Negative for nosebleeds and sore throat.   Eyes:  Negative for double vision.  Respiratory:  Negative for cough, hemoptysis, sputum production, shortness of breath and wheezing.   Cardiovascular:  Negative for chest pain, palpitations, orthopnea and leg swelling.  Gastrointestinal:  Negative for abdominal pain, blood in stool, constipation, diarrhea, heartburn, melena, nausea and vomiting.  Genitourinary:  Negative for dysuria, frequency and urgency.  Musculoskeletal:  Negative for back pain and joint pain.  Skin: Negative.  Negative for itching and rash.  Neurological:  Negative for dizziness, tingling, focal weakness, weakness and headaches.  Endo/Heme/Allergies:  Does not bruise/bleed easily.  Psychiatric/Behavioral:  Negative for depression. The patient is not nervous/anxious and does not have insomnia.      MEDICAL HISTORY:  Past Medical History:  Diagnosis Date   Hemorrhoids     Hypothyroidism    Obstructive sleep apnea on CPAP    Sleep apnea    Thrombocytopenia ([facility])     SURGICAL HISTORY: Past Surgical History:  Procedure Laterality Date   Radius surgeries x 6     Right ACL surgery  2012    SOCIAL HISTORY:  Social History   Socioeconomic History   Marital status: Married    Spouse name: Not on file   Number of children: Not on file   Years of education: Not on file   Highest education level: Not on file  Occupational History   Not on file  Tobacco Use   Smoking status: Never   Smokeless tobacco: Never  Substance and Sexual Activity   Alcohol use: No   Drug use: No   Sexual activity: Not on file  Other Topics Concern   Not on file  Social History Narrative   ocassional alochol; no smoking; assistant principal; [employer], Plays golf.    Social Drivers of Corporate investment banker Strain: Low Risk  (08/14/2023)   Received from [facility]   Overall Financial Resource Strain (CARDIA)    Difficulty of Paying Living Expenses: Not hard at all  Food Insecurity: No Food Insecurity (08/14/2023)   Received from [facility]   Hunger Vital Sign    Worried About Running Out of Food in the Last Year: Never true    Ran Out of Food in the Last Year: Never true  Transportation Needs: No Transportation Needs (08/14/2023)   Received from [facility] - Transportation    In the past 12 months, has lack of transportation kept you from medical appointments or from getting medications?: No    Lack of Transportation (Non-Medical): No  Physical  Activity: Not on file  Stress: Not on file  Social Connections: Not on file  Intimate Partner Violence: Not on file    FAMILY HISTORY: grandma/mat- low platelets.  History reviewed. No pertinent family history.  ALLERGIES:  has no known allergies.  MEDICATIONS:  Current Outpatient Medications  Medication Sig Dispense Refill   ibuprofen (ADVIL) 200 MG  tablet Take by mouth.     LANTUS SOLOSTAR 100 UNIT/ML Solostar Pen Inject into the skin.     levothyroxine (SYNTHROID) 88 MCG tablet Take 1 tablet by mouth daily.     metFORMIN (GLUCOPHAGE-XR) 500 MG 24 hr tablet Take 500 mg by mouth 2 (two) times daily with a meal.     No current facility-administered medications for this visit.      Marland Kitchen  PHYSICAL EXAMINATION: ECOG PERFORMANCE STATUS: 0 - Asymptomatic  Vitals:   09/08/23 0913  BP: 106/64  Pulse: 92  Temp: 97.7 F (36.5 C)  SpO2: 99%   Filed Weights   09/08/23 0913  Weight: 210 lb 6.4 oz (95.4 kg)    Physical Exam Constitutional:      Comments: Ambulating independently; alone.   HENT:     Head: Normocephalic and atraumatic.     Mouth/Throat:     Pharynx: No oropharyngeal exudate.  Eyes:     Pupils: Pupils are equal, round, and reactive to light.  Cardiovascular:     Rate and Rhythm: Normal rate and regular rhythm.  Pulmonary:     Effort: No respiratory distress.     Breath sounds: No wheezing.  Abdominal:     General: Bowel sounds are normal. There is no distension.     Palpations: Abdomen is soft. There is no mass.     Tenderness: There is no abdominal tenderness. There is no guarding or rebound.  Musculoskeletal:        General: Deformity present. No tenderness. Normal range of motion.     Cervical back: Normal range of motion and neck supple.     Comments: Congenital deformity bilateral upper extremities.  Skin:    General: Skin is warm.  Neurological:     Mental Status: He is alert and oriented to person, place, and time.  Psychiatric:        Mood and Affect: Affect normal.      LABORATORY DATA:  I have reviewed the data as listed Lab Results  Component Value Date   WBC 11.5 (H) 09/08/2023   HGB 15.5 09/08/2023   HCT 45.4 09/08/2023   MCV 87.0 09/08/2023   PLT 107 (L) 09/08/2023   Recent Labs    09/08/23 0902  NA 134*  K 3.8  CL 104  CO2 22  GLUCOSE 117*  BUN 21*  CREATININE 0.82   CALCIUM 9.0  GFRNONAA >60  PROT 7.1  ALBUMIN 4.7  AST 13*  ALT 14  ALKPHOS 52  BILITOT 1.4*     RADIOGRAPHIC STUDIES: I have personally reviewed the radiological images as listed and agreed with the findings in the report. No results found.  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  Asymptomatic platelets today- 80-90s; stable; continue surveillaince; CBC [with PCP;TSH ] q 3 M.  No indication for therapy at this time. stable.  # CHRONIC Mild Leucocytosis/neutrophilia- WBC 11- not increasing-stable.   # hereditary hemochromatosis [JULY 2023]: Father/ broether diagnosed with hemochromatosis; c.845G>A (p.Cys282Tyr) - Detected, heterozygous- no phlebotomy.  Reviewed the pathophysiology of hemochromatosis.  July 2023 saturation 17%; monitor for  now.  # Iron def without anemia: Colonoscopy - [11/13/20]  - Three tubular adenomas, Gr II internal hemorrhoids s/p banding; EGD: 10/28/2004- stable; monitor for now. HOLD off PO iron.   # FASTING BG- 117 [on CGM]; continue increasing exercise/weight loss.  Recommend follow-up with PCP guarding diabetes management.  # DISPOSITION: # follow up in 12  months-  - MD; labs- cbc/cmp;  iron studies/ferrittin- -Dr.B  Cc; Dr.L      Earna Coder, MD 09/08/2023 10:08 AM

## 2023-09-08 NOTE — Progress Notes (Signed)
Bruising: no  Bleeding from gums: no  DX with diabetes 12/24. Changed diet to bring A1C down, hast lost 20lbs.

## 2023-12-07 ENCOUNTER — Encounter: Attending: Endocrinology | Admitting: Dietician

## 2023-12-07 ENCOUNTER — Encounter: Payer: Self-pay | Admitting: Dietician

## 2023-12-07 DIAGNOSIS — Z794 Long term (current) use of insulin: Secondary | ICD-10-CM | POA: Insufficient documentation

## 2023-12-07 DIAGNOSIS — Z7984 Long term (current) use of oral hypoglycemic drugs: Secondary | ICD-10-CM | POA: Insufficient documentation

## 2023-12-07 DIAGNOSIS — E119 Type 2 diabetes mellitus without complications: Secondary | ICD-10-CM | POA: Insufficient documentation

## 2023-12-07 DIAGNOSIS — Z713 Dietary counseling and surveillance: Secondary | ICD-10-CM | POA: Diagnosis not present

## 2023-12-07 NOTE — Progress Notes (Signed)
 Diabetes Self-Management Education  Visit Type: First/Initial  Appt. Start Time: 1405 Appt. End Time: 1310  12/07/2023  [name], identified by name and date of birth, is a 48 y.o. male with a diagnosis of Diabetes: Type 2.   ASSESSMENT Pt reports trying to make changes around September of 2024, stopped drinking soda and was trying to lose weight, noticed increased polyuria/polydipsia that became severe and had to go to see PCP, A1c was 11.2%, started on metformin and Lantus @10u  nightly. Pt reports significant improvement in A1c (5.2%, 11/27/2023) since getting reading of 11.2% in December, 2024, taken off of Lantus, continuing metformin BID. Pt reports weight loss of around 50 lbs since making changes. Pt reports starting day with high protein breakfast, low carbs, measuring carbs, incorporating more leafy greens, paying close attention to how food affect glucose Pt reports using Dexcom G7 CGM, finds it extremely beneficial for glucose control. CGM Results from download:   Average glucose:   114 mg/dL for 33 days  Glucose management indicator:   6.0 %  Time in range (70-180 mg/dL):   98 %   (Goal >91%)  Time High (181-250 mg/dL):   1 %   (Goal < 47%)  Time Very High (>250 mg/dL):    1 %   (Goal < 5%)  Time Low (54-69 mg/dL):   1 %   (Goal <8%)  Time Very Low (<54 mg/dL):   1 %   (Goal <2%)      Diabetes Self-Management Education - 12/07/23 1512       Visit Information   Visit Type First/Initial      Initial Visit   Diabetes Type Type 2    Date Diagnosed 07/2023    Are you currently following a meal plan? Yes    What type of meal plan do you follow? Low carb, high protein, healthy fats    Are you taking your medications as prescribed? Yes      Health Coping   How would you rate your overall health? Good      Psychosocial Assessment   Patient Belief/Attitude about Diabetes Motivated to manage diabetes    What is the hardest part about your diabetes right now, causing  you the most concern, or is the most worrisome to you about your diabetes?   Making healty food and beverage choices    Self-care barriers None    Self-management support Doctor's office;Family    Other persons present Patient    Patient Concerns Nutrition/Meal planning;Glycemic Control    Special Needs None    Preferred Learning Style Visual    Learning Readiness Change in progress    How often do you need to have someone help you when you read instructions, pamphlets, or other written materials from your doctor or pharmacy? 1 - Never    What is the last grade level you completed in school? Doctorate      Pre-Education Assessment   Patient understands the diabetes disease and treatment process. Needs Instruction    Patient understands incorporating nutritional management into lifestyle. Comprehends key points    Patient undertands incorporating physical activity into lifestyle. Comprehends key points    Patient understands using medications safely. Comprehends key points    Patient understands monitoring blood glucose, interpreting and using results Needs Review    Patient understands prevention, detection, and treatment of acute complications. Comprehends key points    Patient understands prevention, detection, and treatment of chronic complications. Compreheands key points  Patient understands how to develop strategies to address psychosocial issues. Comprehends key points    Patient understands how to develop strategies to promote health/change behavior. Comprehends key points      Complications   Last HgB A1C per patient/outside source 5.2 %   11/27/2023   How often do you check your blood sugar? > 4 times/day   CGM   Fasting Blood glucose range (mg/dL) 25-427    Postprandial Blood glucose range (mg/dL) 062-376;28-315    Have you had a dilated eye exam in the past 12 months? Yes    Have you had a dental exam in the past 12 months? Yes    Are you checking your feet? Yes    How many  days per week are you checking your feet? 3      Activity / Exercise   Activity / Exercise Type ADL's;Moderate (swimming / aerobic walking)    How many days per week do you exercise? 4    How many minutes per day do you exercise? 30    Total minutes per week of exercise 120      Patient Education   Previous Diabetes Education No    Disease Pathophysiology Explored patient's options for treatment of their diabetes;Factors that contribute to the development of diabetes    Healthy Eating Role of diet in the treatment of diabetes and the relationship between the three main macronutrients and blood glucose level;Reviewed blood glucose goals for pre and post meals and how to evaluate the patients' food intake on their blood glucose level.    Being Active Helped patient identify appropriate exercises in relation to his/her diabetes, diabetes complications and other health issue.    Medications Reviewed patients medication for diabetes, action, purpose, timing of dose and side effects.    Monitoring Taught/evaluated CGM (comment)   TIR, GMI   Chronic complications Relationship between chronic complications and blood glucose control      Individualized Goals (developed by patient)   Nutrition Follow meal plan discussed;General guidelines for healthy choices and portions discussed    Physical Activity Exercise 3-5 times per week    Medications take my medication as prescribed    Monitoring  Consistenly use CGM    Reducing Risk examine blood glucose patterns      Post-Education Assessment   Patient understands the diabetes disease and treatment process. Demonstrates understanding / competency    Patient understands incorporating nutritional management into lifestyle. Demonstrates understanding / competency    Patient undertands incorporating physical activity into lifestyle. Demonstrates understanding / competency    Patient understands using medications safely. Demonstrates understanding /  competency    Patient understands monitoring blood glucose, interpreting and using results Demonstrates understanding / competency    Patient understands prevention, detection, and treatment of acute complications. Demonstrates understanding / competency    Patient understands prevention, detection, and treatment of chronic complications. Demonstrates understanding / competency    Patient understands how to develop strategies to address psychosocial issues. Demonstrates understanding / competency    Patient understands how to develop strategies to promote health/change behavior. Demonstrates understanding / competency      Outcomes   Expected Outcomes Demonstrated interest in learning. Expect positive outcomes    Future DMSE 2 months    Program Status Not Completed             Individualized Plan for Diabetes Self-Management Training:   Learning Objective:  Patient will have a greater understanding of diabetes self-management. Patient education plan is  to attend individual and/or group sessions per assessed needs and concerns.   Plan:   Patient Instructions  Begin to incorporate resistance exercises 2-3 times per week. Use a rep range of 8-12 per set (or close to failure) and take no more than 60 seconds between sets of a particular exercise. A 30 minute session is adequate to achieve desired results.  Check your blood sugar each morning before eating or drinking (fasting). Look for numbers between 70-100 mg/dL Check your blood sugar 2 hours after you begin eating a meal. Look for numbers under 180 mg/dL at all times. Look for an elevation of 40-60 points in your blood sugar after a meal!  Work to increase your non-starchy vegetables with your meals and see how it affects your glucose post meal!  Keep up the great work!!!            Expected Outcomes:  Demonstrated interest in learning. Expect positive outcomes  Education material provided: Balanced Plate,  Non-Starchy Vegetable list  If problems or questions, patient to contact team via:  Phone and Email  Future DSME appointment: 2 months

## 2023-12-07 NOTE — Patient Instructions (Addendum)
 Begin to incorporate resistance exercises 2-3 times per week. Use a rep range of 8-12 per set (or close to failure) and take no more than 60 seconds between sets of a particular exercise. A 30 minute session is adequate to achieve desired results.  Check your blood sugar each morning before eating or drinking (fasting). Look for numbers between 70-100 mg/dL Check your blood sugar 2 hours after you begin eating a meal. Look for numbers under 180 mg/dL at all times. Look for an elevation of 40-60 points in your blood sugar after a meal!  Work to increase your non-starchy vegetables with your meals and see how it affects your glucose post meal!  Keep up the great work!!!

## 2024-02-29 ENCOUNTER — Encounter: Payer: Self-pay | Admitting: Dietician

## 2024-02-29 ENCOUNTER — Encounter: Attending: Endocrinology | Admitting: Dietician

## 2024-02-29 DIAGNOSIS — E119 Type 2 diabetes mellitus without complications: Secondary | ICD-10-CM | POA: Diagnosis present

## 2024-02-29 NOTE — Patient Instructions (Addendum)
 Have one of your Fairlife Protein shakes when you get home from your workouts!  Try lowering the amount of carbohydrates with lunch meals and see if that improves your midday glucose values!

## 2024-02-29 NOTE — Progress Notes (Signed)
 Diabetes Self-Management Education  Visit Type: Follow-up  Appt. Start Time: 1400 Appt. End Time: 1440  02/29/2024  [name], identified by name and date of birth, is a 48 y.o. male with a diagnosis of Diabetes:  .   ASSESSMENT  CGM Results from download:   Average glucose:   132 mg/dL for 90 days  Glucose management indicator:   6.5 %  Time in range (70-180 mg/dL):   95 %   (Goal >29%)  Time High (181-250 mg/dL):   4 %   (Goal < 74%)  Time Very High (>250 mg/dL):    1 %   (Goal < 5%)  Time Low (54-69 mg/dL):   1 %   (Goal <5%)  Time Very Low (<54 mg/dL):   1 %   (Goal <8%)  Daily patterns indicate occasional elevated glucose after lunch and dinner at times, but well controlled overall. Pt reports seeing a few spikes in their blood glucose on CGM after trying a few new meals, states they are continuing to work on finding appropriate amount of carbs at meals. Pt reports being more comfortable with their diet now, gradually adding in more carbohydrates instead of being as restrictive as before. Pt reports having Fairlife chocolate protein shakes as a meal replacement at times. Pt reports trying to find more sugar free sweet options Pt reports getting up to cook breakfast each day for themselves and spouse, states they prep lunch meal for next day from their dinner prior to serving their dinner meal, states this has been very beneficial in portion control at dinner time Pt reports lifting weights 3 days a week, going to exercise after dinner meal   Diabetes Self-Management Education - 02/29/24 1622       Visit Information   Visit Type Follow-up      Pre-Education Assessment   Patient understands the diabetes disease and treatment process. Comprehends key points    Patient understands incorporating nutritional management into lifestyle. Comprehends key points    Patient undertands incorporating physical activity into lifestyle. Comprehends key points    Patient understands  using medications safely. Comprehends key points    Patient understands monitoring blood glucose, interpreting and using results Comprehends key points    Patient understands prevention, detection, and treatment of acute complications. Comprehends key points    Patient understands prevention, detection, and treatment of chronic complications. Compreheands key points    Patient understands how to develop strategies to address psychosocial issues. Comprehends key points    Patient understands how to develop strategies to promote health/change behavior. Comprehends key points      Complications   How often do you check your blood sugar? > 4 times/day    Fasting Blood glucose range (mg/dL) 29-870    Postprandial Blood glucose range (mg/dL) >799;819-799;869-820      Dietary Intake   Breakfast 2 eggs, 1 pancake,    Lunch 2 chicken minis, water    Dinner Steak, salad w/ oil and vinegar, portabello mushroom w/ cheese and tomato      Activity / Exercise   Activity / Exercise Type ADL's;Moderate (swimming / aerobic walking)    How many days per week do you exercise? 3    How many minutes per day do you exercise? 60    Total minutes per week of exercise 180      Patient Education   Healthy Eating Reviewed blood glucose goals for pre and post meals and how to evaluate the patients' food intake  on their blood glucose level.    Being Active Identified with patient nutritional and/or medication changes necessary with exercise.    Monitoring Taught/evaluated CGM (comment)   TIR, AGP     Individualized Goals (developed by patient)   Nutrition General guidelines for healthy choices and portions discussed    Medications take my medication as prescribed    Monitoring  Consistenly use CGM      Patient Self-Evaluation of Goals - Patient rates self as meeting previously set goals (% of time)   Nutrition 50 - 75 % (half of the time)    Physical Activity 50 - 75 % (half of the time)    Medications >75%  (most of the time)    Monitoring >75% (most of the time)    Problem Solving and behavior change strategies  >75% (most of the time)    Reducing Risk (treating acute and chronic complications) >75% (most of the time)    Health Coping >75% (most of the time)      Post-Education Assessment   Patient understands the diabetes disease and treatment process. Demonstrates understanding / competency    Patient understands incorporating nutritional management into lifestyle. Demonstrates understanding / competency    Patient undertands incorporating physical activity into lifestyle. Demonstrates understanding / competency    Patient understands using medications safely. Demonstrates understanding / competency    Patient understands monitoring blood glucose, interpreting and using results Demonstrates understanding / competency    Patient understands prevention, detection, and treatment of acute complications. Demonstrates understanding / competency    Patient understands prevention, detection, and treatment of chronic complications. Demonstrates understanding / competency    Patient understands how to develop strategies to address psychosocial issues. Demonstrates understanding / competency    Patient understands how to develop strategies to promote health/change behavior. Demonstrates understanding / competency      Outcomes   Expected Outcomes Demonstrated interest in learning. Expect positive outcomes    Future DMSE 6 months    Program Status Not Completed      Subsequent Visit   Since your last visit have you continued or begun to take your medications as prescribed? Yes    Since your last visit have you experienced any weight changes? Loss    Weight Loss (lbs) 5    Since your last visit, are you checking your blood glucose at least once a day? Yes   CGM         Individualized Plan for Diabetes Self-Management Training:   Learning Objective:  Patient will have a greater understanding of  diabetes self-management. Patient education plan is to attend individual and/or group sessions per assessed needs and concerns.   Plan:   Patient Instructions  Have one of your Fairlife Protein shakes when you get home from your workouts!  Try lowering the amount of carbohydrates with lunch meals and see if that improves your midday glucose values!    Expected Outcomes:  Demonstrated interest in learning. Expect positive outcomes  If problems or questions, patient to contact team via:  Phone and Email  Future DSME appointment: 6 months

## 2024-06-21 ENCOUNTER — Ambulatory Visit (INDEPENDENT_AMBULATORY_CARE_PROVIDER_SITE_OTHER): Payer: Self-pay

## 2024-06-21 DIAGNOSIS — Z09 Encounter for follow-up examination after completed treatment for conditions other than malignant neoplasm: Secondary | ICD-10-CM | POA: Diagnosis present

## 2024-06-21 DIAGNOSIS — Z860101 Personal history of adenomatous and serrated colon polyps: Secondary | ICD-10-CM | POA: Diagnosis not present

## 2024-06-21 DIAGNOSIS — K635 Polyp of colon: Secondary | ICD-10-CM | POA: Diagnosis not present

## 2024-06-21 DIAGNOSIS — K640 First degree hemorrhoids: Secondary | ICD-10-CM | POA: Diagnosis not present

## 2024-09-06 ENCOUNTER — Inpatient Hospital Stay: Payer: 59 | Admitting: Internal Medicine

## 2024-09-06 ENCOUNTER — Encounter: Payer: Self-pay | Admitting: Internal Medicine

## 2024-09-06 ENCOUNTER — Inpatient Hospital Stay: Payer: 59 | Attending: Internal Medicine

## 2024-09-06 VITALS — BP 113/77 | HR 80 | Temp 97.4°F | Resp 18 | Ht 68.5 in | Wt 218.6 lb

## 2024-09-06 DIAGNOSIS — Z794 Long term (current) use of insulin: Secondary | ICD-10-CM | POA: Insufficient documentation

## 2024-09-06 DIAGNOSIS — E611 Iron deficiency: Secondary | ICD-10-CM | POA: Insufficient documentation

## 2024-09-06 DIAGNOSIS — E119 Type 2 diabetes mellitus without complications: Secondary | ICD-10-CM | POA: Insufficient documentation

## 2024-09-06 DIAGNOSIS — D696 Thrombocytopenia, unspecified: Secondary | ICD-10-CM

## 2024-09-06 DIAGNOSIS — D72829 Elevated white blood cell count, unspecified: Secondary | ICD-10-CM | POA: Diagnosis not present

## 2024-09-06 DIAGNOSIS — Z720 Tobacco use: Secondary | ICD-10-CM | POA: Insufficient documentation

## 2024-09-06 LAB — CMP (CANCER CENTER ONLY)
ALT: 12 U/L (ref 0–44)
AST: 16 U/L (ref 15–41)
Albumin: 4.6 g/dL (ref 3.5–5.0)
Alkaline Phosphatase: 59 U/L (ref 38–126)
Anion gap: 12 (ref 5–15)
BUN: 17 mg/dL (ref 6–20)
CO2: 21 mmol/L — ABNORMAL LOW (ref 22–32)
Calcium: 9.2 mg/dL (ref 8.9–10.3)
Chloride: 106 mmol/L (ref 98–111)
Creatinine: 0.93 mg/dL (ref 0.61–1.24)
GFR, Estimated: >60 mL/min
Glucose, Bld: 148 mg/dL — ABNORMAL HIGH (ref 70–99)
Potassium: 4 mmol/L (ref 3.5–5.1)
Sodium: 139 mmol/L (ref 135–145)
Total Bilirubin: 1.3 mg/dL — ABNORMAL HIGH (ref 0.0–1.2)
Total Protein: 6.7 g/dL (ref 6.5–8.1)

## 2024-09-06 LAB — CBC WITH DIFFERENTIAL (CANCER CENTER ONLY)
Abs Immature Granulocytes: 0.1 10*3/uL — ABNORMAL HIGH (ref 0.00–0.07)
Basophils Absolute: 0.1 10*3/uL (ref 0.0–0.1)
Basophils Relative: 1 %
Eosinophils Absolute: 0.2 10*3/uL (ref 0.0–0.5)
Eosinophils Relative: 2 %
HCT: 44.9 % (ref 39.0–52.0)
Hemoglobin: 15.7 g/dL (ref 13.0–17.0)
Immature Granulocytes: 1 %
Lymphocytes Relative: 18 %
Lymphs Abs: 2.1 10*3/uL (ref 0.7–4.0)
MCH: 29.3 pg (ref 26.0–34.0)
MCHC: 35 g/dL (ref 30.0–36.0)
MCV: 83.9 fL (ref 80.0–100.0)
Monocytes Absolute: 0.5 10*3/uL (ref 0.1–1.0)
Monocytes Relative: 5 %
Neutro Abs: 8.6 10*3/uL — ABNORMAL HIGH (ref 1.7–7.7)
Neutrophils Relative %: 73 %
Platelet Count: 78 10*3/uL — ABNORMAL LOW (ref 150–400)
RBC: 5.35 MIL/uL (ref 4.22–5.81)
RDW: 14.9 % (ref 11.5–15.5)
WBC Count: 11.6 10*3/uL — ABNORMAL HIGH (ref 4.0–10.5)
nRBC: 0 % (ref 0.0–0.2)

## 2024-09-06 LAB — IRON AND TIBC
Iron: 81 ug/dL (ref 45–182)
Saturation Ratios: 25 % (ref 17.9–39.5)
TIBC: 326 ug/dL (ref 250–450)
UIBC: 245 ug/dL

## 2024-09-06 LAB — FERRITIN: Ferritin: 100 ng/mL (ref 24–336)

## 2024-09-06 NOTE — Progress Notes (Signed)
 Easy bruising: YES Petechiae (bleeding under skin): NO Gingival bleeding (gums): YES Epistaxis (nose bleeds): NO  Hematochezia (blood in stools): NO  Hematuria (blood in urine): NO

## 2024-09-06 NOTE — Progress Notes (Signed)
 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   # MILD CHRONIC NEUTROPHILIA- [?2006]  # Congenital Bilateral Absent Radii  Oncology History   No problem history exists.    HISTORY OF PRESENTING ILLNESS: Alone.  Ambulating independently.  Kent Gordon 49 y.o.  male very pleasant, with a history of congenital bilateral absent radius and long-standing history of thrombocytopenia/mild neutrophilia; hemochromatosis heterozygous- Is here for follow-up.    Discussed the use of AI scribe software for clinical note transcription with the patient, who gave verbal consent to proceed.  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  He has chronic mild thrombocytopenia, with platelet counts typically ranging from 60,000 to 100,000/L, most recently in the 70,000 to 80,000/L range. He reports longstanding gum issues, which he attributes to aggressive brushing, but does not report new symptoms. He notes longstanding gum issues, which he attributes to aggressive brushing, but denies new symptoms. He otherwise feels well.  Chronic mild leukocytosis has been stable, with his most recent white blood cell count slightly elevated but lower than previous values. He denies fever, infection, or constitutional symptoms.  He has type 2 diabetes mellitus, previously managed with insulin. In April of the previous year, he experienced nocturnal hypoglycemia with blood glucose levels of 49-50 mg/dL, leading to temporary discontinuation of insulin. Subsequent  laboratory testing showed an HbA1c of 5.7%, and he was advised to stop insulin. His most recent fasting glucose was 120 mg/dL. He acknowledges dietary challenges, particularly during winter and holidays, but has eliminated sodas and is working to maintain glycemic control.  He has been confined to his home for the past week due to remote learning responsibilities, which has temporarily affected his motivation. 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       Review of Systems  Constitutional:  Negative for chills, diaphoresis, fever, malaise/fatigue and weight loss.  HENT:  Negative for nosebleeds and sore throat.   Eyes:  Negative for double vision.  Respiratory:  Negative for cough, hemoptysis, sputum production, shortness of breath and wheezing.   Cardiovascular:  Negative for chest pain, palpitations, orthopnea and leg swelling.  Gastrointestinal:  Negative for abdominal pain, blood in stool, constipation, diarrhea, heartburn, melena, nausea and vomiting.  Genitourinary:  Negative for dysuria, frequency and urgency.  Musculoskeletal:  Negative for back pain and joint pain.  Skin: Negative.  Negative for itching and rash.  Neurological:  Negative for dizziness, tingling, focal weakness, weakness and headaches.  Endo/Heme/Allergies:  Does not bruise/bleed easily.  Psychiatric/Behavioral:  Negative for depression. The patient is not nervous/anxious and does not have insomnia.      MEDICAL HISTORY:  Past Medical History:  Diagnosis Date   Hemorrhoids    Hypothyroidism    Obstructive sleep apnea on CPAP    Sleep apnea    Thrombocytopenia     SURGICAL HISTORY: Past Surgical History:  Procedure Laterality Date   Radius surgeries x 6     Right ACL surgery  2012    SOCIAL HISTORY:  Social History   Socioeconomic History   Marital status: Married  Spouse name: Not on file   Number of children: Not on file   Years of education: Not on file    Highest education level: Not on file  Occupational History   Not on file  Tobacco Use   Smoking status: Never   Smokeless tobacco: Never  Substance and Sexual Activity   Alcohol use: No   Drug use: No   Sexual activity: Not on file  Other Topics Concern   Not on file  Social History Narrative   ocassional alochol; no smoking; assistant principal; [place], Plays golf.    Social Drivers of Health   Tobacco Use: Low Risk (09/06/2024)   Patient History    Smoking Tobacco Use: Never    Smokeless Tobacco Use: Never    Passive Exposure: Not on file  Financial Resource Strain: Low Risk  (08/14/2023)   Received from [facility]   Overall Financial Resource Strain (CARDIA)    Difficulty of Paying Living Expenses: Not hard at all  Food Insecurity: No Food Insecurity (08/14/2023)   Received from [facility]   Epic    Within the past 12 months, you worried that your food would run out before you got the money to buy more.: Never true    Within the past 12 months, the food you bought just didn't last and you didn't have money to get more.: Never true  Transportation Needs: No Transportation Needs (08/14/2023)   Received from [facility] - Transportation    In the past 12 months, has lack of transportation kept you from medical appointments or from getting medications?: No    Lack of Transportation (Non-Medical): No  Physical Activity: Not on file  Stress: Not on file  Social Connections: Not on file  Intimate Partner Violence: Not on file  Depression (PHQ2-9): Low Risk (09/06/2024)   Depression (PHQ2-9)    PHQ-2 Score: 0  Alcohol Screen: Not on file  Housing: Low Risk  (08/15/2023)   Received from [facility]   Epic    In the last 12 months, was there a time when you were not able to pay the mortgage or rent on time?: No    In the past 12 months, how many times have you moved where you were living?: 0    At  any time in the past 12 months, were you homeless or living in a shelter (including now)?: No  Utilities: Not At Risk (08/14/2023)   Received from [facility]    Threatened with loss of utilities: No  Health Literacy: Not on file    FAMILY HISTORY: grandma/mat- low platelets.  History reviewed. No pertinent family history.  ALLERGIES:  has no known allergies.  MEDICATIONS:  Current Outpatient Medications  Medication Sig Dispense Refill   levothyroxine (SYNTHROID) 88 MCG tablet Take 1 tablet by mouth daily.     metFORMIN (GLUCOPHAGE-XR) 500 MG 24 hr tablet Take 500 mg by mouth 2 (two) times daily with a meal.     No current facility-administered medications for this visit.      SABRA  PHYSICAL EXAMINATION: ECOG PERFORMANCE STATUS: 0 - Asymptomatic  Vitals:   09/06/24 0911  BP: 113/77  Pulse: 80  Resp: 18  Temp: (!) 97.4 F (36.3 C)  SpO2: 96%   Filed Weights   09/06/24 0911  Weight: 218 lb 9.6 oz (99.2 kg)    Physical Exam Constitutional:  Comments: Ambulating independently; alone.   HENT:     Head: Normocephalic and atraumatic.     Mouth/Throat:     Pharynx: No oropharyngeal exudate.  Eyes:     Pupils: Pupils are equal, round, and reactive to light.  Cardiovascular:     Rate and Rhythm: Normal rate and regular rhythm.  Pulmonary:     Effort: No respiratory distress.     Breath sounds: No wheezing.  Abdominal:     General: Bowel sounds are normal. There is no distension.     Palpations: Abdomen is soft. There is no mass.     Tenderness: There is no abdominal tenderness. There is no guarding or rebound.  Musculoskeletal:        General: Deformity present. No tenderness. Normal range of motion.     Cervical back: Normal range of motion and neck supple.     Comments: Congenital deformity bilateral upper extremities.  Skin:    General: Skin is warm.  Neurological:     Mental Status: He is alert and oriented to person,  place, and time.  Psychiatric:        Mood and Affect: Affect normal.      LABORATORY DATA:  I have reviewed the data as listed Lab Results  Component Value Date   WBC 11.6 (H) 09/06/2024   HGB 15.7 09/06/2024   HCT 44.9 09/06/2024   MCV 83.9 09/06/2024   PLT 78 (L) 09/06/2024   Recent Labs    09/08/23 0902 09/06/24 0919  NA 134* 139  K 3.8 4.0  CL 104 106  CO2 22 21*  GLUCOSE 117* 148*  BUN 21* 17  CREATININE 0.82 0.93  CALCIUM 9.0 9.2  GFRNONAA >60 >60  PROT 7.1 6.7  ALBUMIN 4.7 4.6  AST 13* 16  ALT 14 12  ALKPHOS 52 59  BILITOT 1.4* 1.3*     RADIOGRAPHIC STUDIES: I have personally reviewed the radiological images as listed and agreed with the findings in the report. No results found.  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  Asymptomatic platelets today- 80-90s; stable; continue surveillaince; CBC [with PCP;TSH ] q 3 M.  No indication for therapy at this time. stable.  # CHRONIC Mild Leucocytosis/neutrophilia- WBC 11- not increasing-stable.   # hereditary hemochromatosis [JULY 2023]: Father  diagnosed with hemochromatosis; c.845G>A (p.Cys282Tyr) - Detected, heterozygous- no phlebotomy.  Reviewed the pathophysiology of hemochromatosis.  July 2023 saturation 17%; monitor for now.  # Iron def without anemia: Colonoscopy - [11/13/20]  - Three tubular adenomas, Gr II internal hemorrhoids s/p banding; EGD: 10/28/2004- stable; monitor for now. HOLD off PO iron.   # FASTING BG- 117 [on CGM]; continue increasing dietary changes/ exercise/weight loss- Significant improvement [Hb A1c-from 11- 5.1]  # DISPOSITION: # follow up in 12  months-  - MD; labs- cbc/cmp;  iron studies/ferrittin- -Dr.B  Cc; Dr.L       Cindy JONELLE Joe, MD 09/06/2024 10:08 AM

## 2024-09-06 NOTE — Assessment & Plan Note (Addendum)
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  Asymptomatic platelets today- 80-90s; stable; continue surveillaince; CBC [with PCP;TSH ] q 3 M.  No indication for therapy at this time. stable.  # CHRONIC Mild Leucocytosis/neutrophilia- WBC 11- not increasing-stable.   # hereditary hemochromatosis [JULY 2023]: Father  diagnosed with hemochromatosis; c.845G>A (p.Cys282Tyr) - Detected, heterozygous- no phlebotomy.  Reviewed the pathophysiology of hemochromatosis.  July 2023 saturation 17%; monitor for now.  # Iron def without anemia: Colonoscopy - [11/13/20]  - Three tubular adenomas, Gr II internal hemorrhoids s/p banding; EGD: 10/28/2004- stable; monitor for now. HOLD off PO iron.   # FASTING BG- 117 [on CGM]; continue increasing dietary changes/ exercise/weight loss- Significant improvement [Hb A1c-from 11- 5.1]  # DISPOSITION: # follow up in 12  months-  - MD; labs- cbc/cmp;  iron studies/ferrittin- -Dr.B  Cc; Dr.L

## 2025-09-08 ENCOUNTER — Inpatient Hospital Stay: Admitting: Internal Medicine

## 2025-09-08 ENCOUNTER — Inpatient Hospital Stay
